# Patient Record
Sex: Female | Born: 1990 | Race: White | Hispanic: No | Marital: Single | Smoking: Current every day smoker
Health system: Southern US, Community
[De-identification: ages and names within clinical notes are randomized; demographics above are authoritative.]

## PROBLEM LIST (undated history)

## (undated) DIAGNOSIS — J45909 Unspecified asthma, uncomplicated: Secondary | ICD-10-CM

## (undated) HISTORY — PX: CHOLECYSTECTOMY: SHX55

## (undated) HISTORY — PX: DILATION AND CURETTAGE OF UTERUS: SHX78

---
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
## 2010-06-29 ENCOUNTER — Encounter: Payer: Self-pay | Admitting: Obstetrics and Gynecology

## 2010-11-13 IMAGING — US US OB DETAIL+14 WK
1 series · 18 of 28 positions shown · non-contrast
Comparison: none

OBSTETRICAL ULTRASOUND:
 This ultrasound was performed in The [HOSPITAL], and the [facility] report will be stored to [REDACTED] PACS.  This report is also available in [HOSPITAL]?s accessANYware.

[Series 1: us ob detail+14 wk · 18 of 72 slices shown]
[im 1/72]
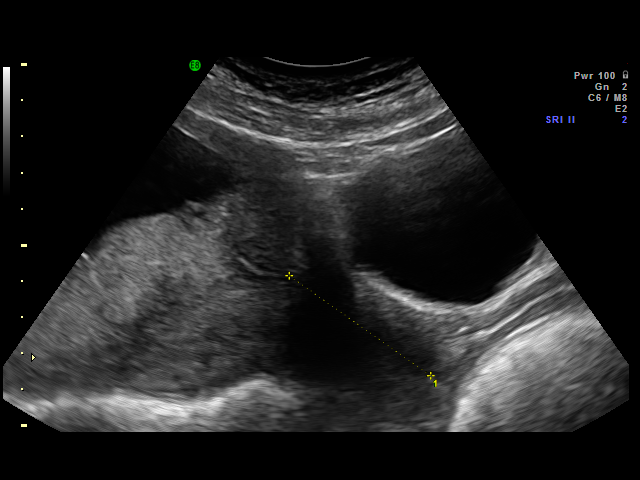
[im 6/72]
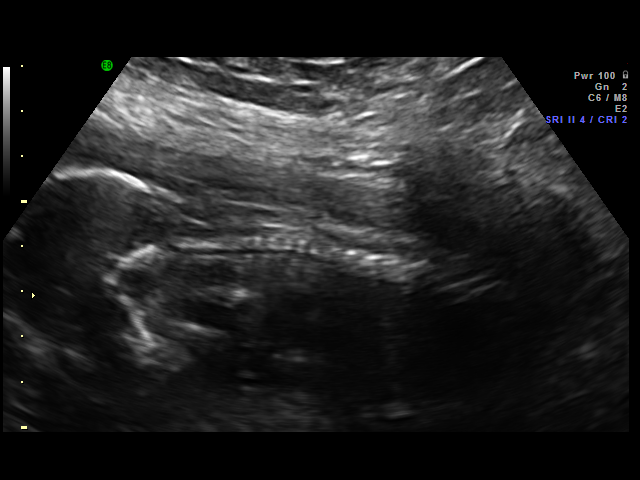
[im 8/72]
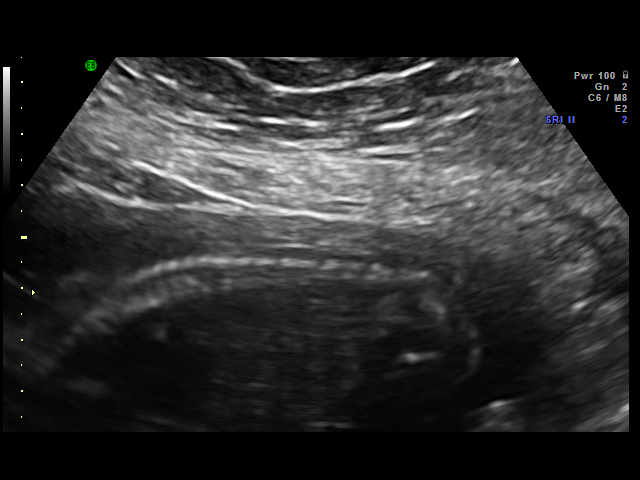
[im 14/72]
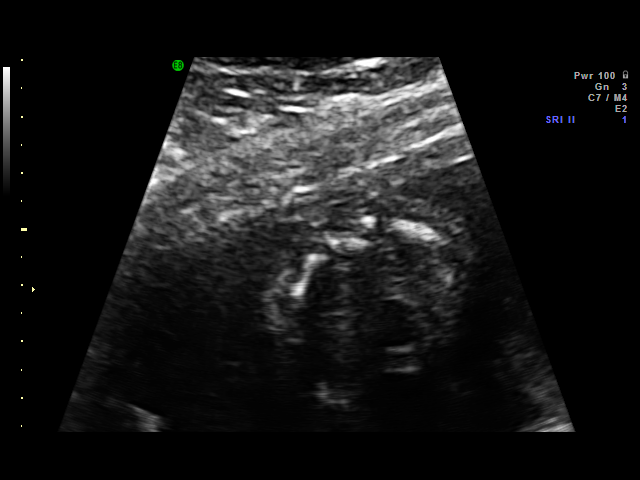
[im 19/72]
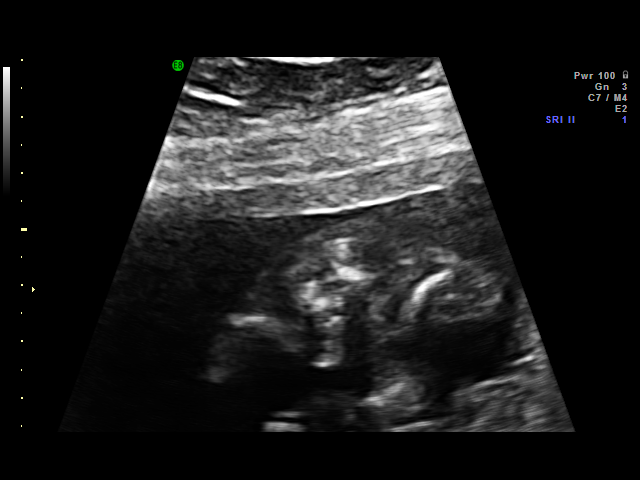
[im 22/72]
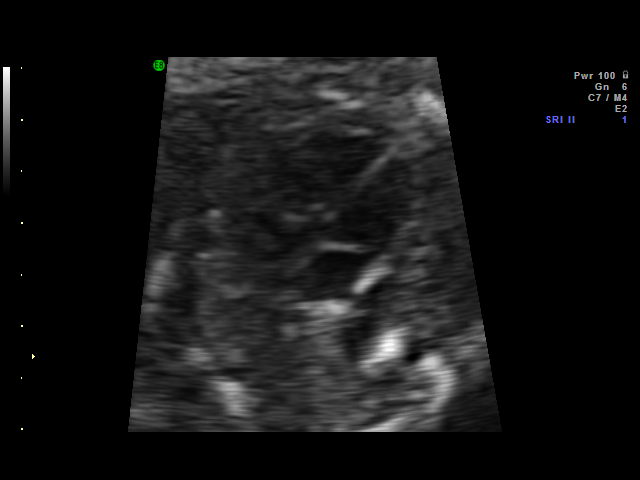
[im 27/72]
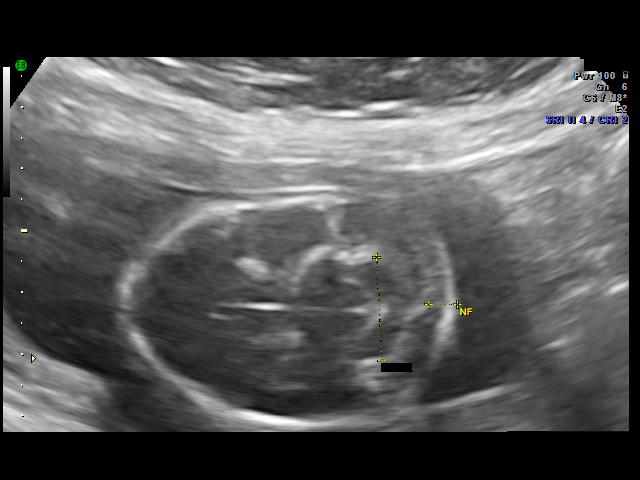
[im 29/72]
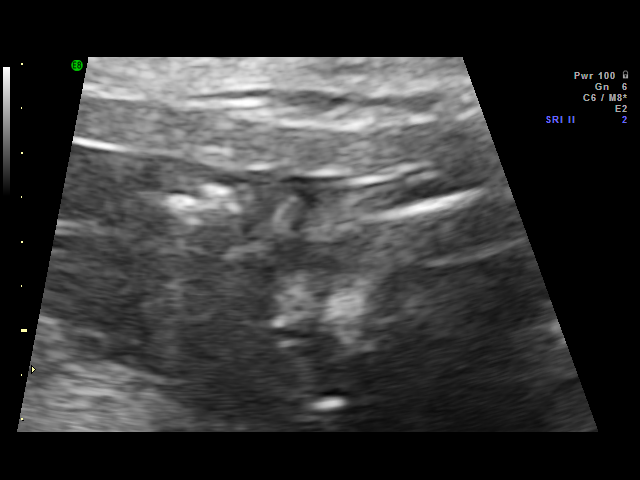
[im 35/72]
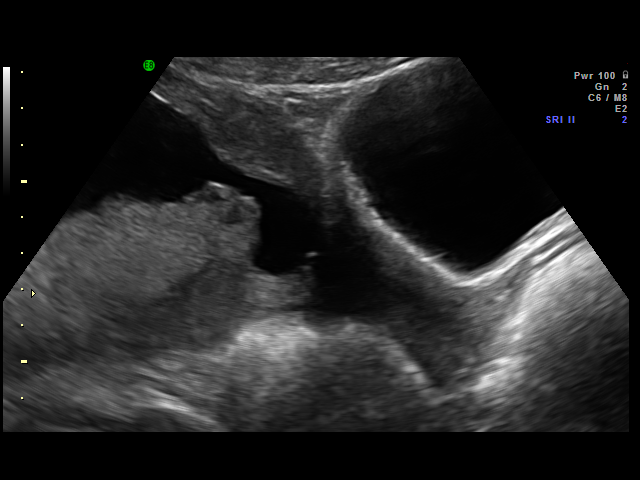
[im 37/72]
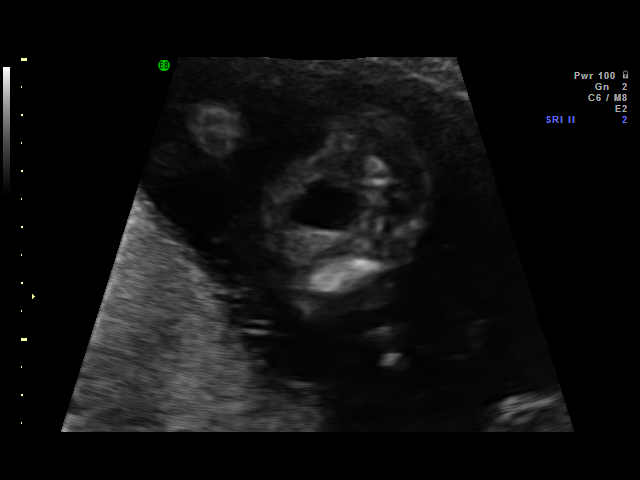
[im 43/72]
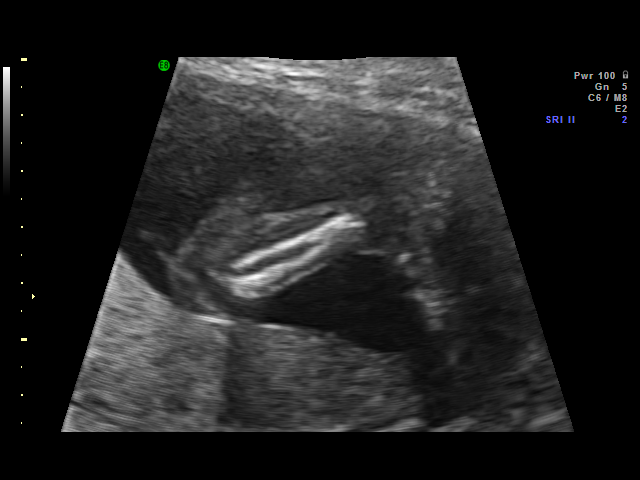
[im 45/72]
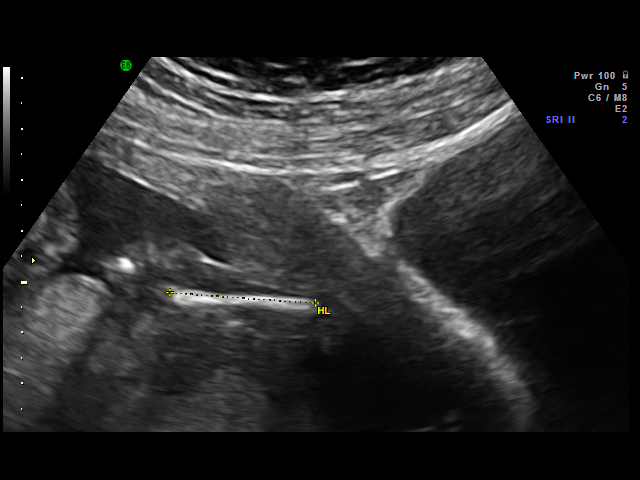
[im 50/72]
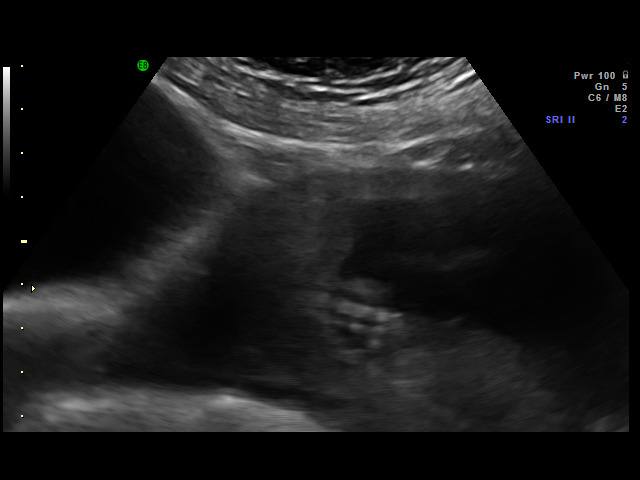
[im 56/72]
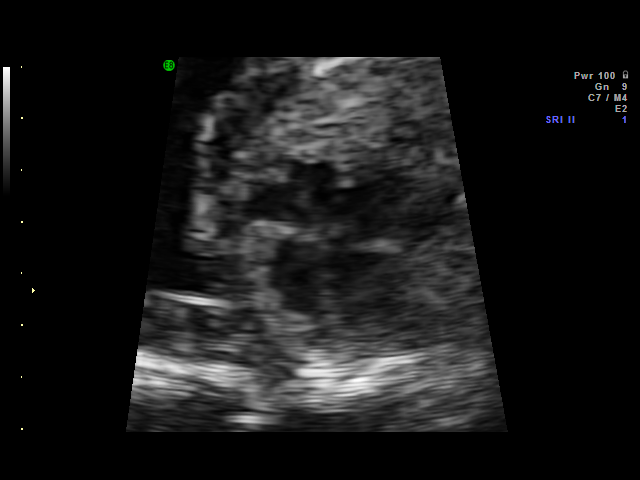
[im 58/72]
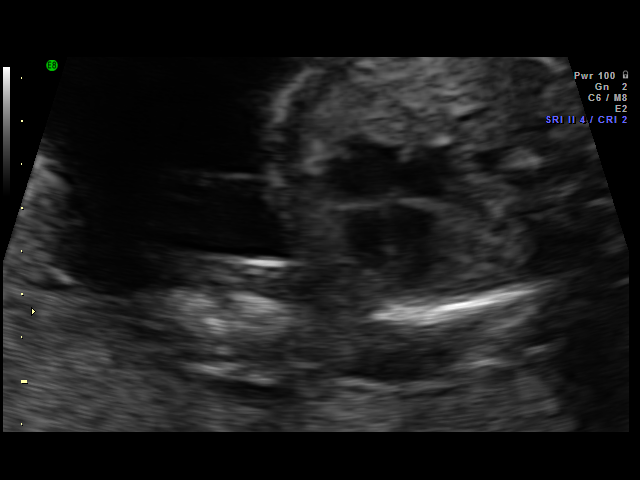
[im 64/72]
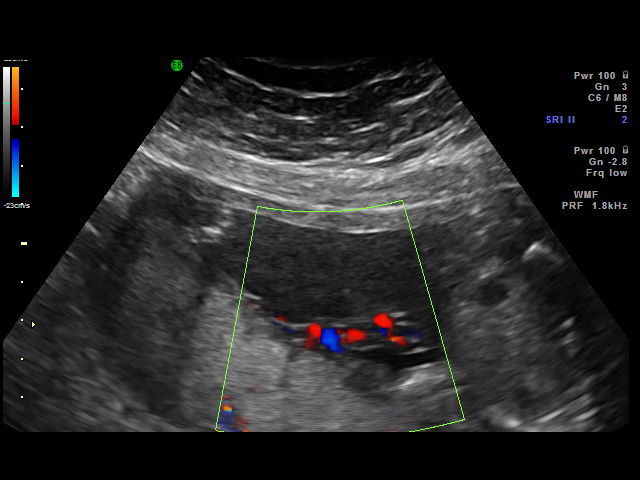
[im 66/72]
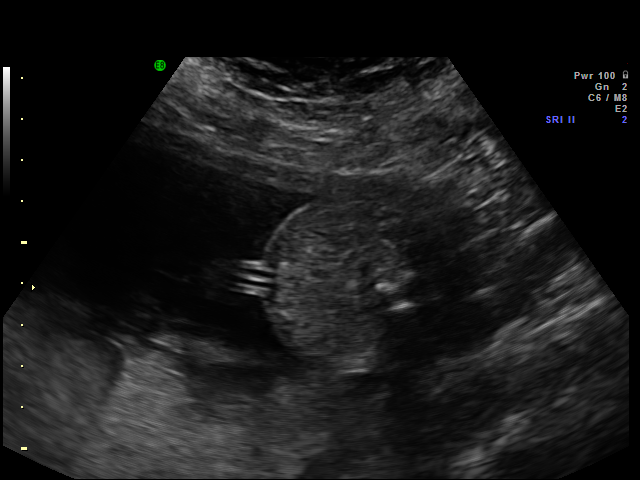
[im 72/72]
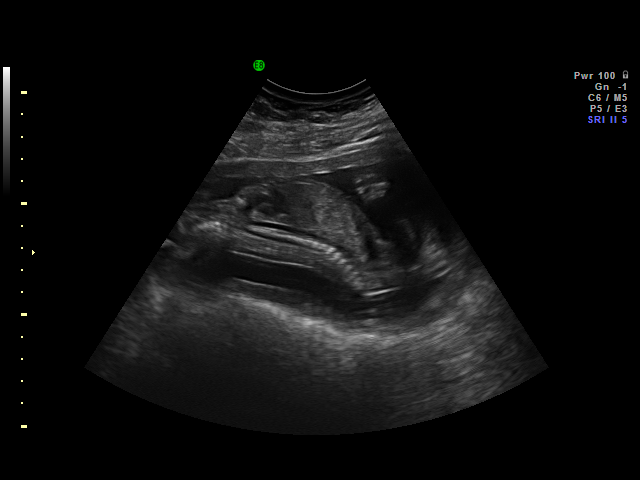

[18 of 28 positions shown; findings below may reference images not displayed]

IMPRESSION: [facility] has also been faxed to the ordering physician.

## 2010-11-27 IMAGING — US US OB FOLLOW-UP
1 series · 18 of 28 positions shown · non-contrast
Comparison: none

OBSTETRICAL ULTRASOUND:
 This ultrasound was performed in The [HOSPITAL], and the [facility] report will be stored to [REDACTED] PACS.  This report is also available in [HOSPITAL]?s accessANYware.

[Series 1: us ob follow-up · 18 of 79 slices shown]
[im 1/79]
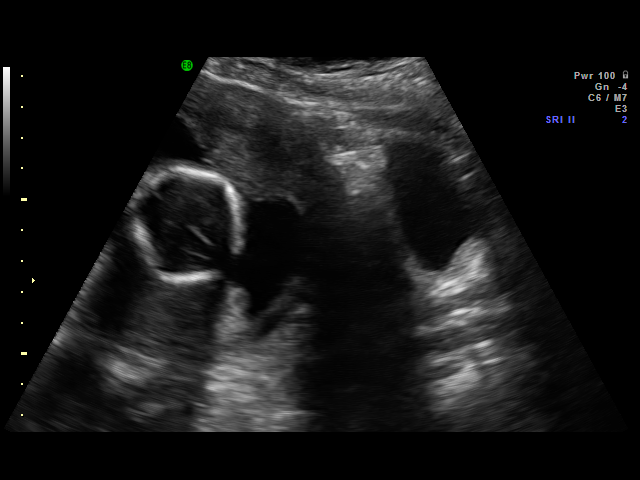
[im 6/79]
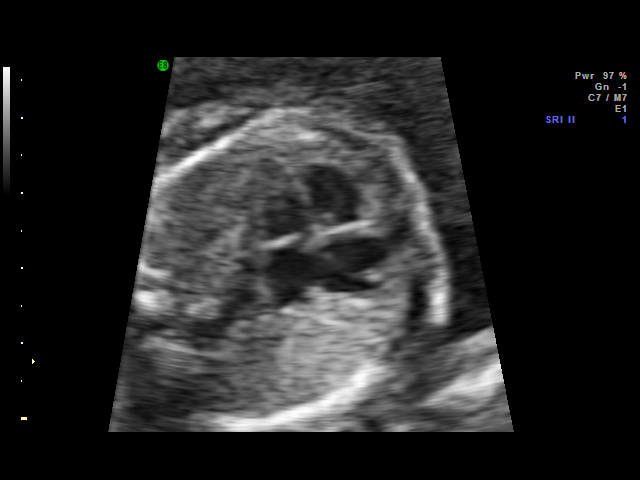
[im 9/79]
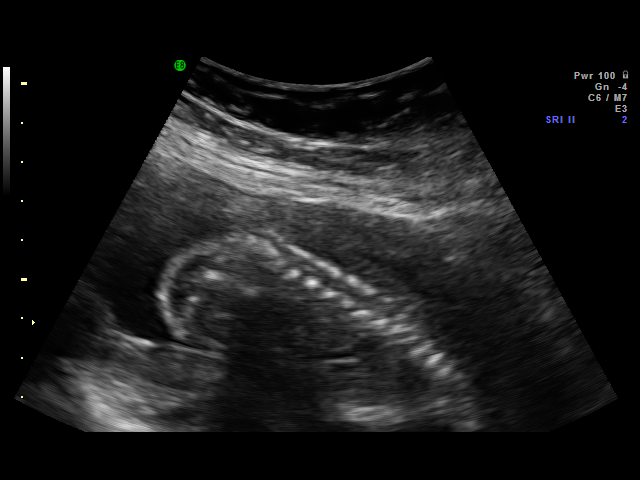
[im 15/79]
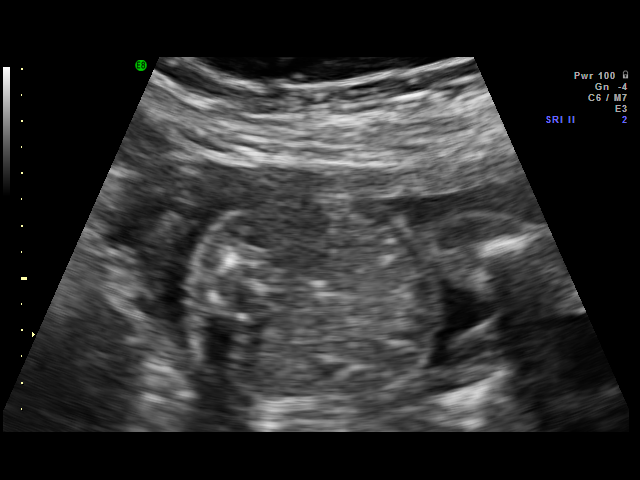
[im 21/79]
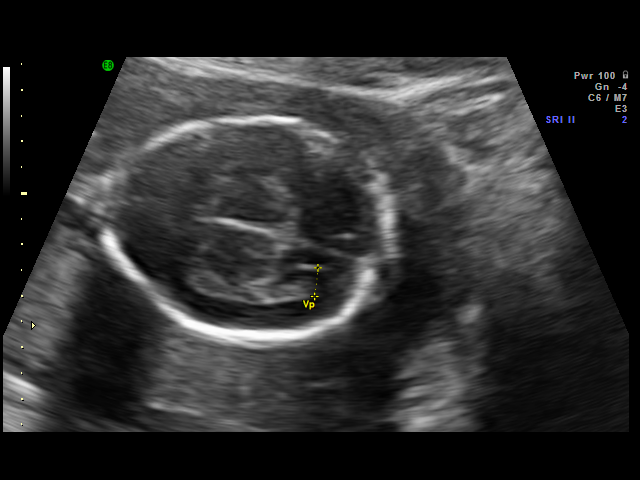
[im 24/79]
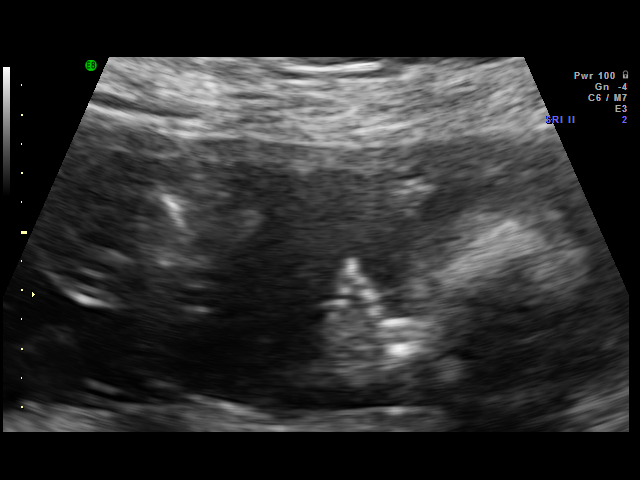
[im 29/79]
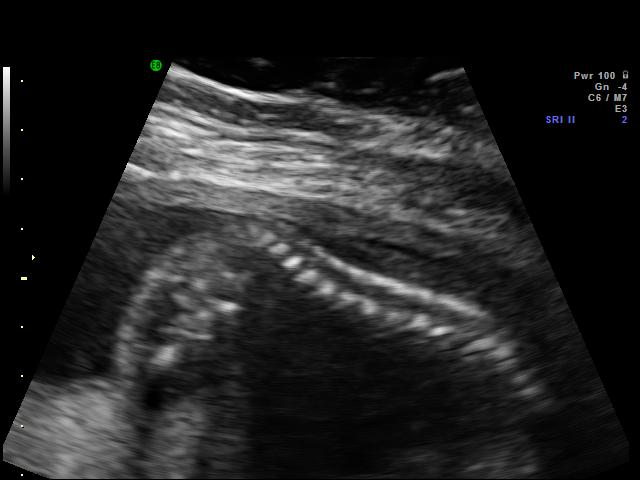
[im 32/79]
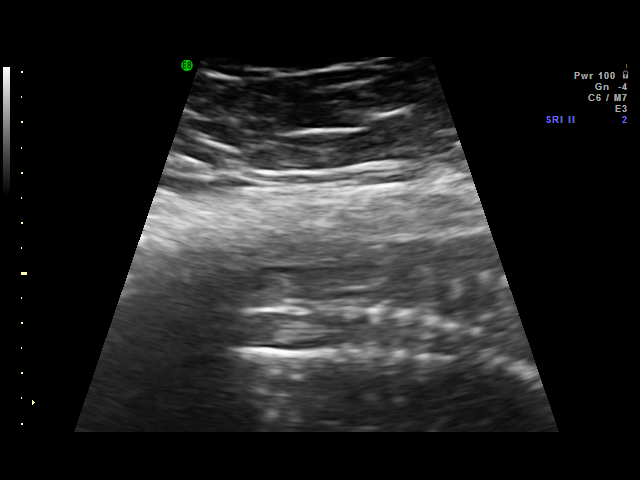
[im 38/79]
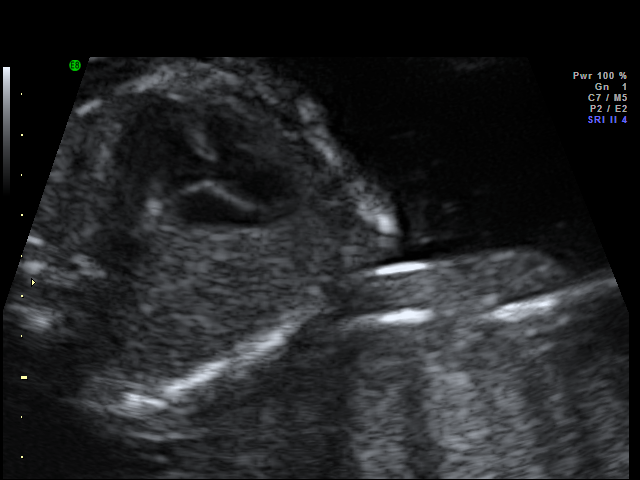
[im 41/79]
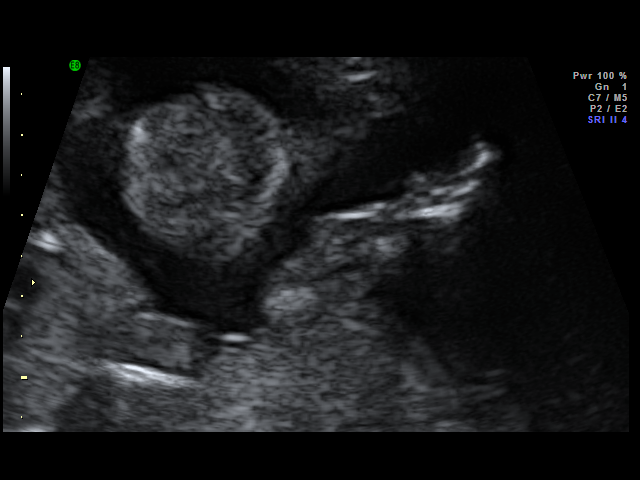
[im 47/79]
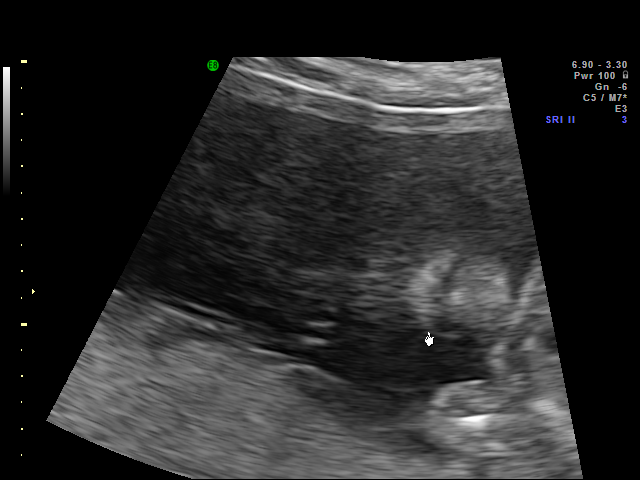
[im 50/79]
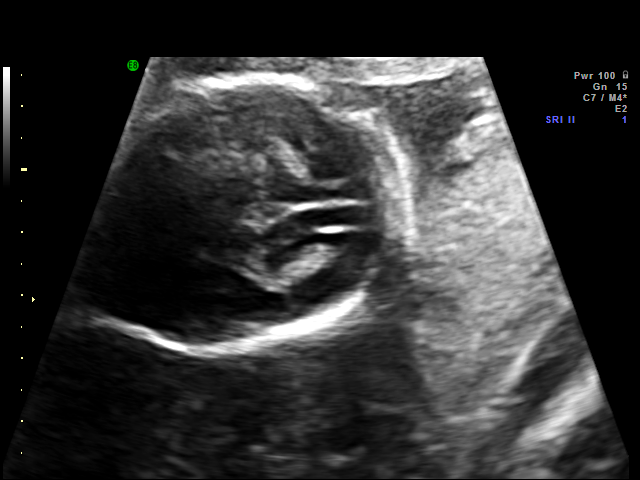
[im 55/79]
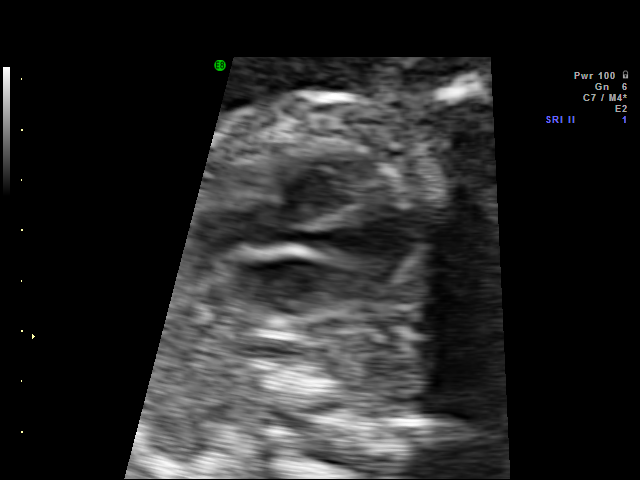
[im 61/79]
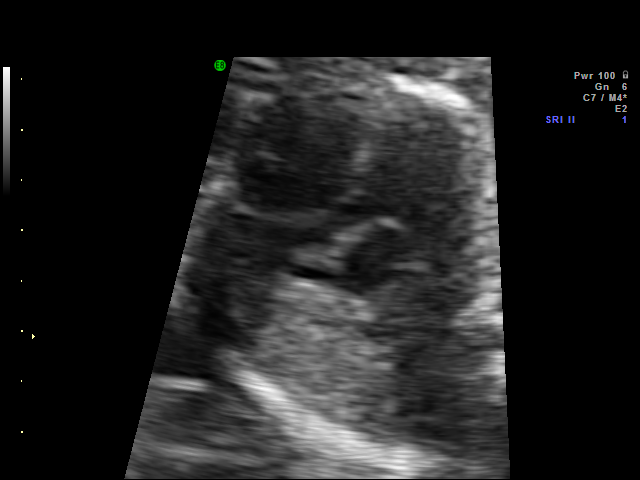
[im 64/79]
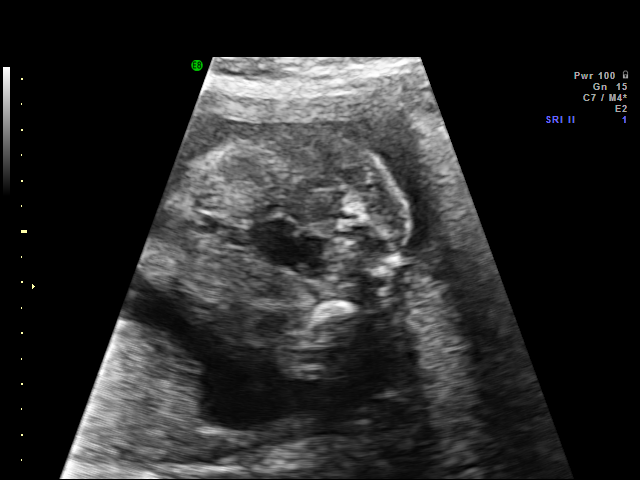
[im 70/79]
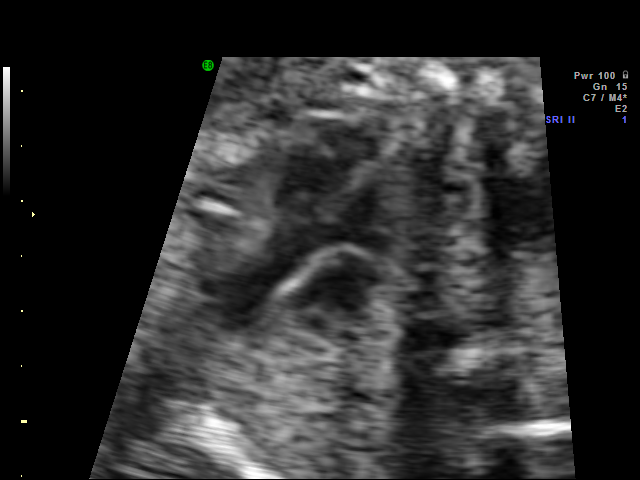
[im 73/79]
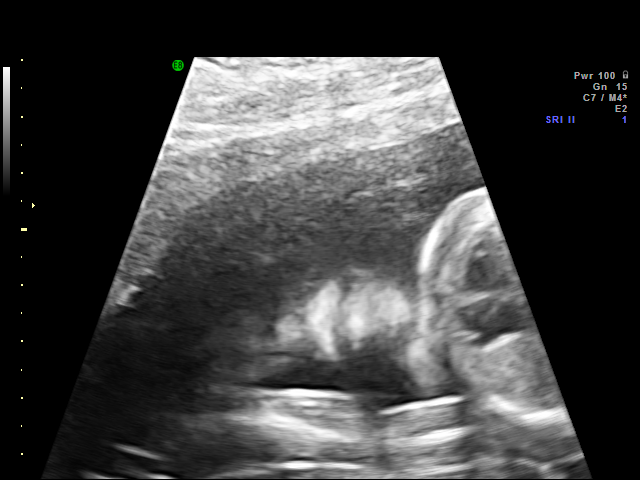
[im 79/79]
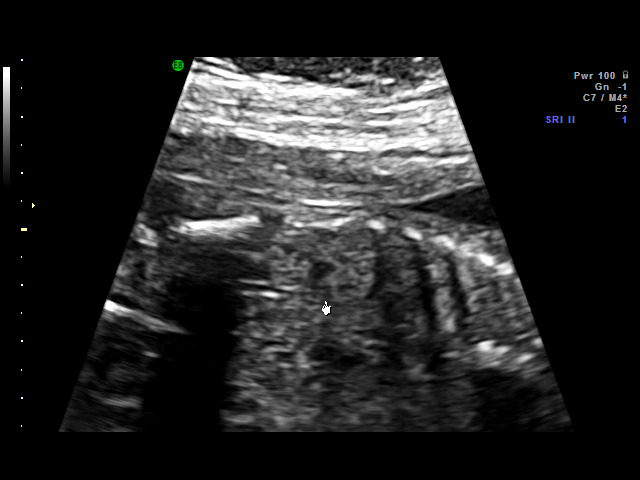

[18 of 28 positions shown; findings below may reference images not displayed]

IMPRESSION: [facility] has also been faxed to the ordering physician.

## 2011-02-12 IMAGING — US US OB FOLLOW-UP
1 series · 14 of 28 positions shown · non-contrast
Comparison: none

OBSTETRICAL ULTRASOUND:
 This ultrasound was performed in The [HOSPITAL], and the [facility] report will be stored to [REDACTED] PACS.  This report is also available in [HOSPITAL]?s accessANYware.

[Series 1: us ob follow-up · 37 acquisitions, 14 frames shown]
[im 2/37]
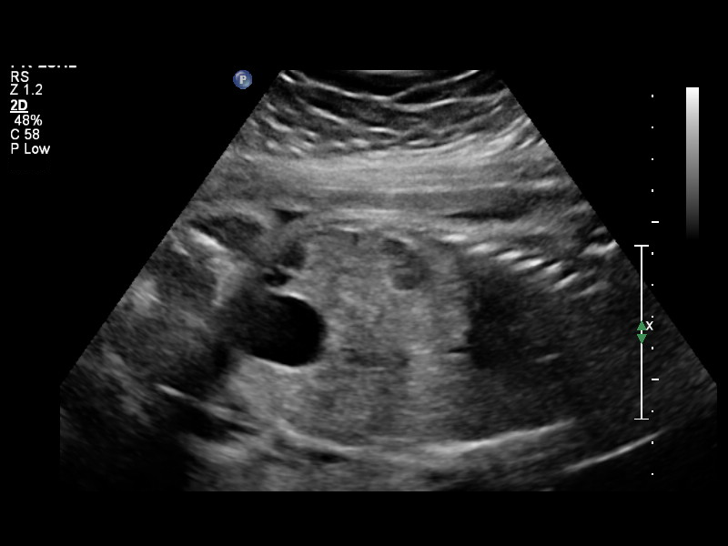
[im 5/37]
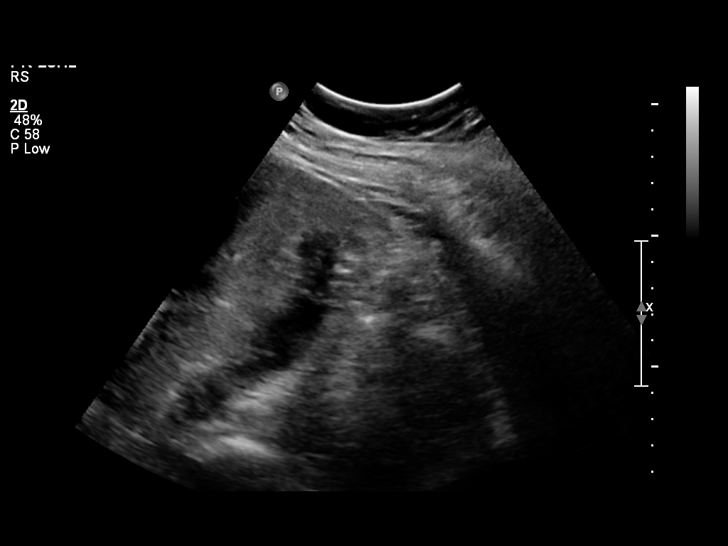
[im 7/37]
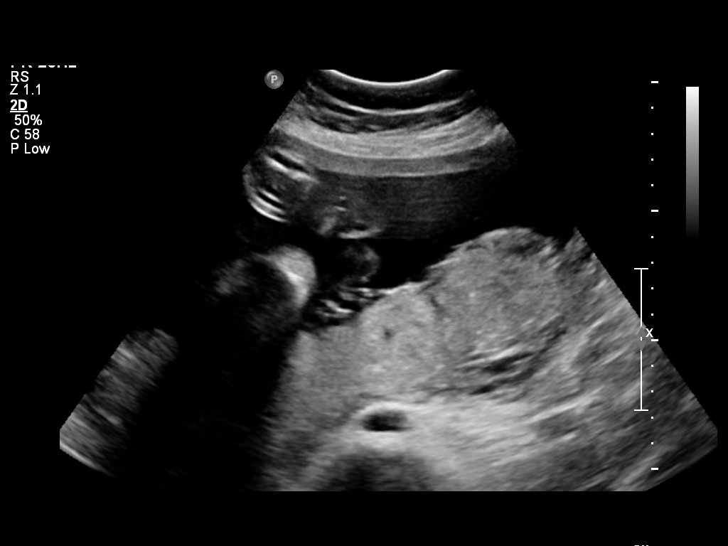
[im 10/37]
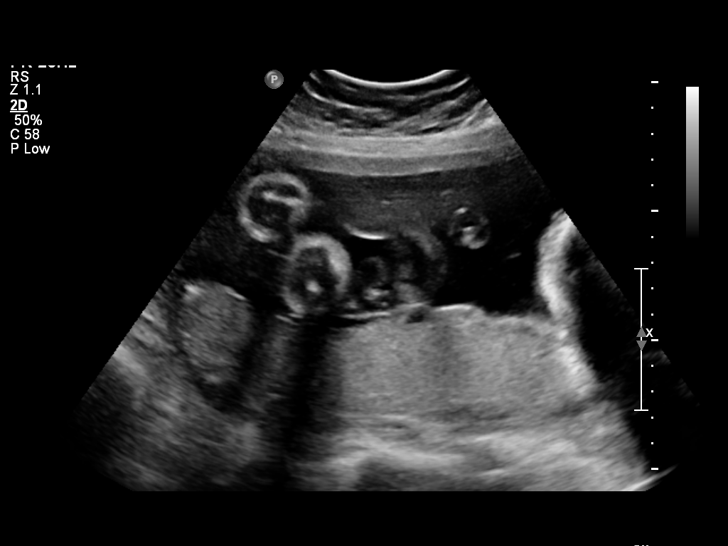
[im 13/37]
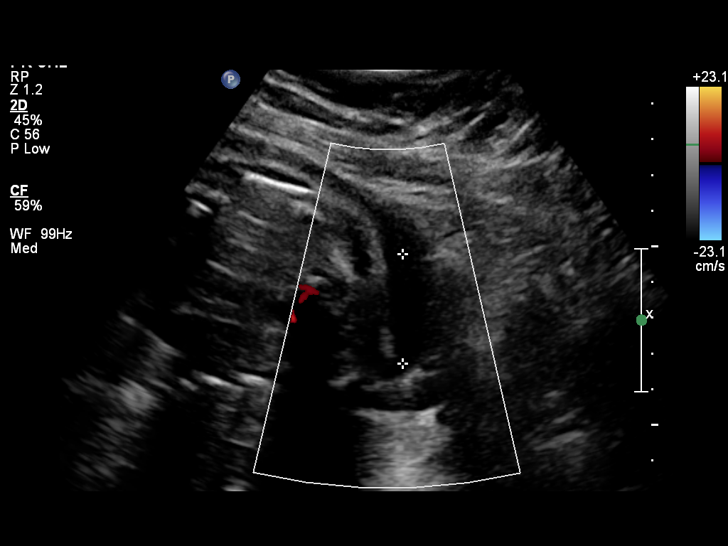
[im 15/37]
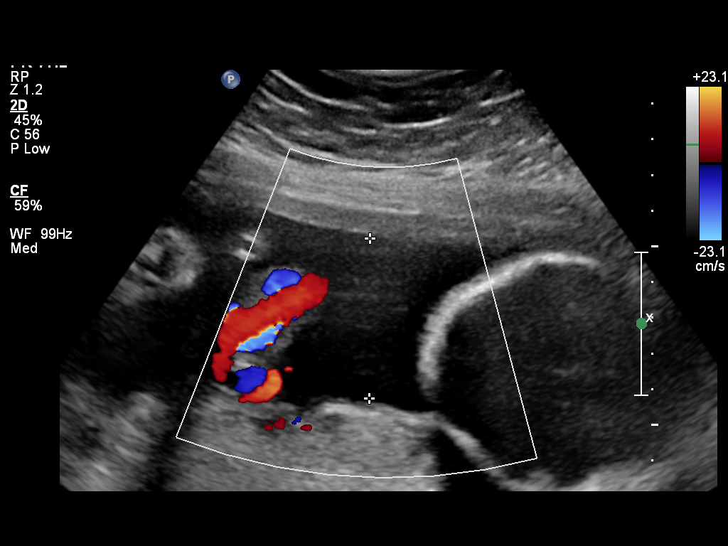
[im 18/37]
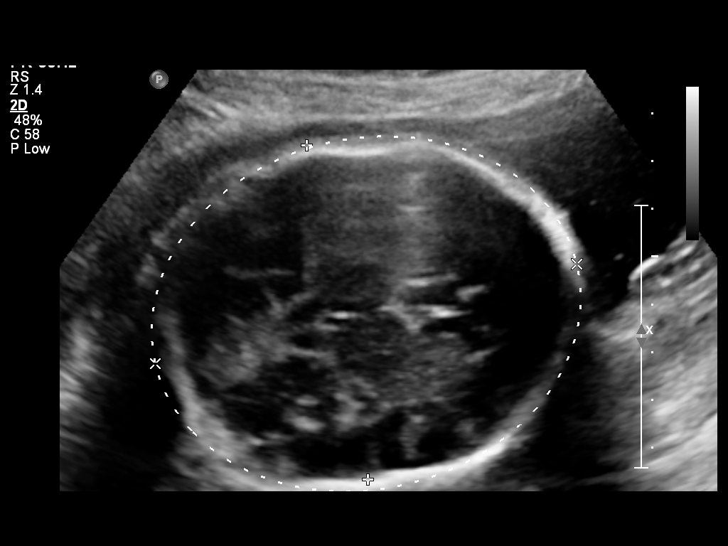
[im 21/37]
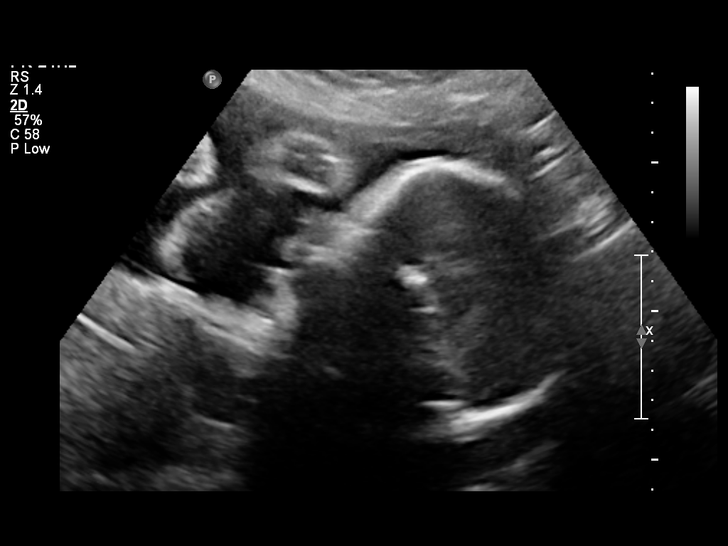
[im 23/37]
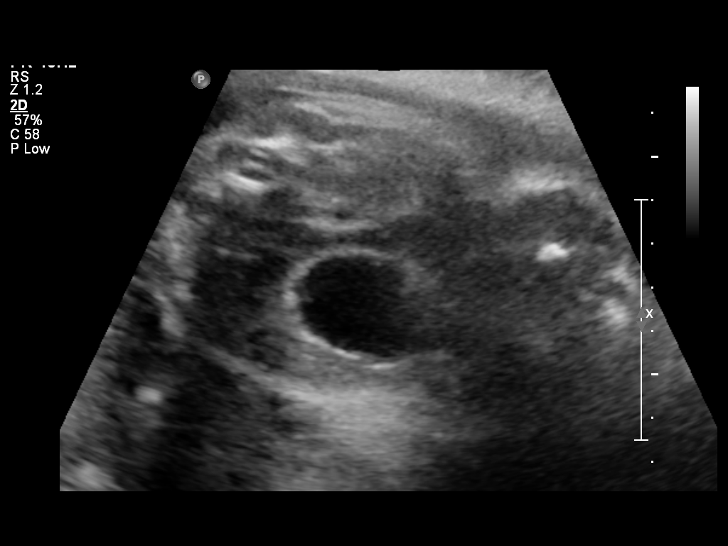
[im 26/37]
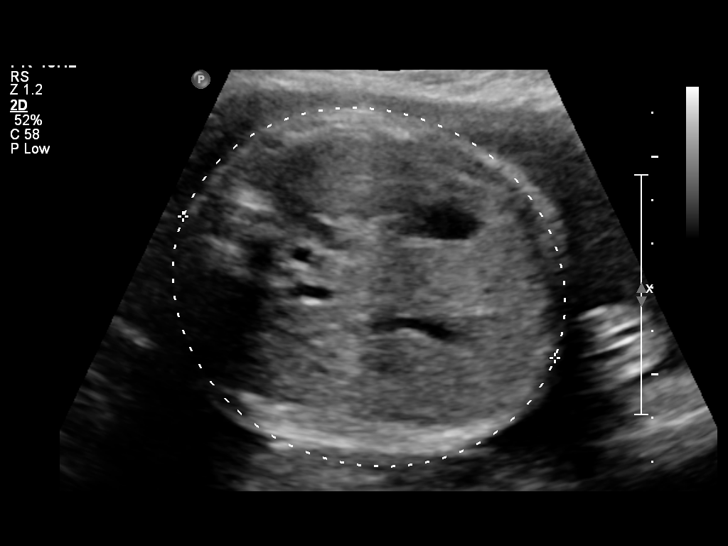
[im 29/37]
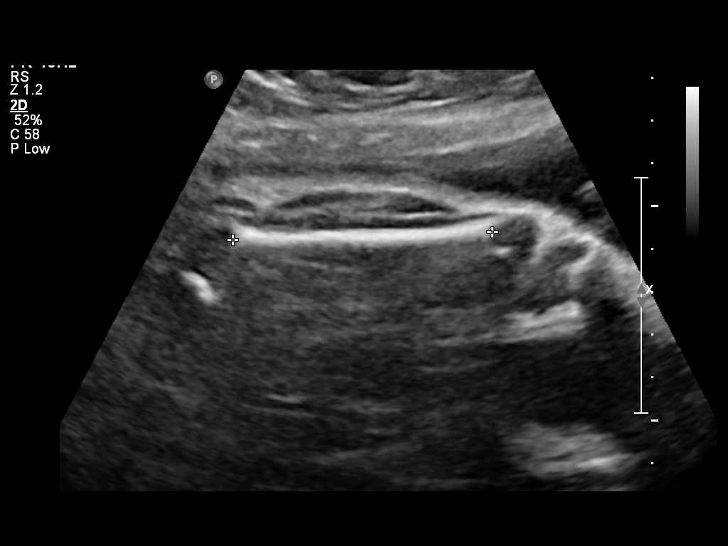
[im 31/37]
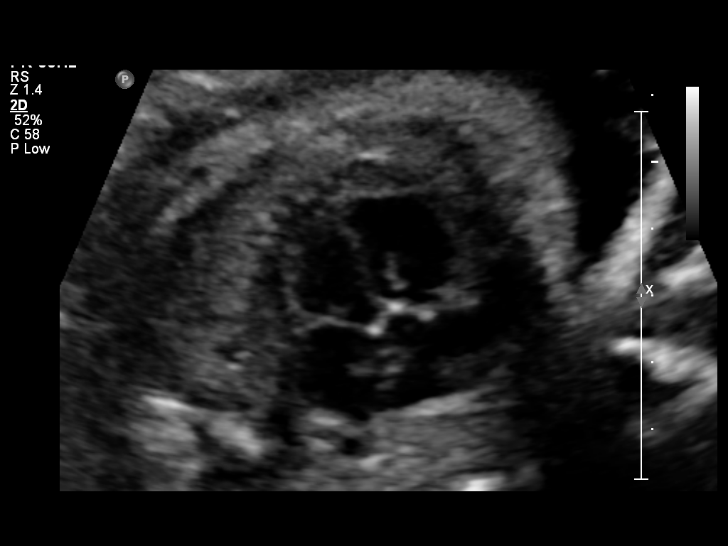
[im 34/37]
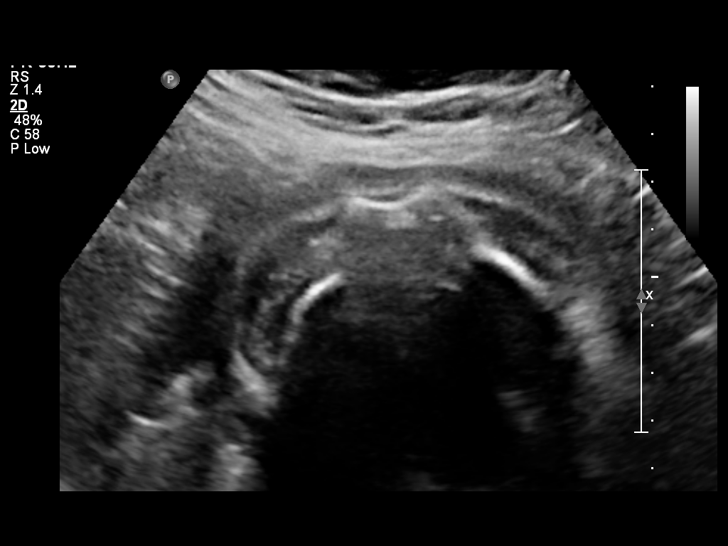
[im 37/37]
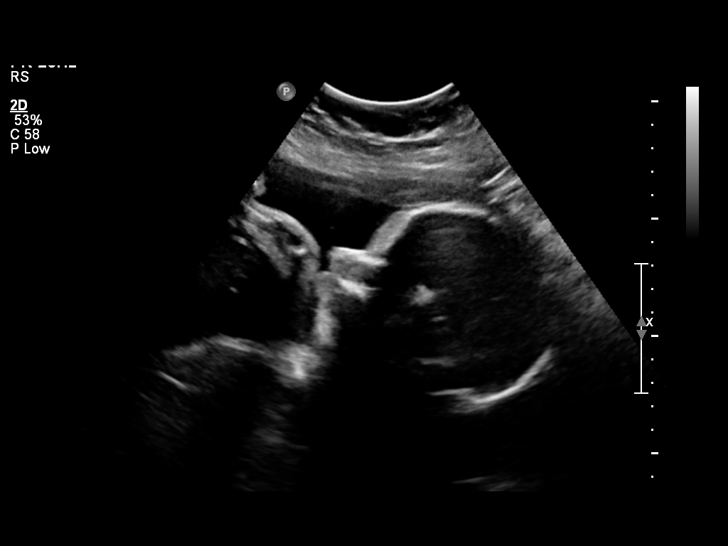

[14 of 28 positions shown; findings below may reference images not displayed]

IMPRESSION: [facility] has also been faxed to the ordering physician.

## 2021-02-03 ENCOUNTER — Emergency Department (HOSPITAL_COMMUNITY)
Admission: EM | Admit: 2021-02-03 | Discharge: 2021-02-04 | Disposition: A | Discharge status: Home / Self Care | Payer: Self-pay | Attending: Emergency Medicine | Admitting: Emergency Medicine

## 2021-02-03 ENCOUNTER — Other Ambulatory Visit: Payer: Self-pay

## 2021-02-03 DIAGNOSIS — Z5321 Procedure and treatment not carried out due to patient leaving prior to being seen by health care provider: Secondary | ICD-10-CM | POA: Insufficient documentation

## 2021-02-03 DIAGNOSIS — K0889 Other specified disorders of teeth and supporting structures: Secondary | ICD-10-CM | POA: Insufficient documentation

## 2021-02-03 DIAGNOSIS — M79643 Pain in unspecified hand: Secondary | ICD-10-CM | POA: Insufficient documentation

## 2021-02-03 NOTE — ED Triage Notes (Signed)
Pt complains of hand pain and hand numbness that has been going on for a month. Pt also complains of dental pain to her left mouth.

## 2021-03-21 ENCOUNTER — Emergency Department (HOSPITAL_COMMUNITY)
Admission: EM | Admit: 2021-03-21 | Discharge: 2021-03-21 | Disposition: A | Discharge status: Home / Self Care | Payer: Self-pay | Attending: Emergency Medicine | Admitting: Emergency Medicine

## 2021-03-21 ENCOUNTER — Encounter (HOSPITAL_COMMUNITY): Payer: Self-pay | Admitting: Emergency Medicine

## 2021-03-21 DIAGNOSIS — J029 Acute pharyngitis, unspecified: Secondary | ICD-10-CM | POA: Insufficient documentation

## 2021-03-21 DIAGNOSIS — J020 Streptococcal pharyngitis: Secondary | ICD-10-CM

## 2021-03-21 DIAGNOSIS — K029 Dental caries, unspecified: Secondary | ICD-10-CM | POA: Insufficient documentation

## 2021-03-21 LAB — GROUP A STREP BY PCR: Group A Strep by PCR: DETECTED — AB

## 2021-03-21 LAB — MONONUCLEOSIS SCREEN: Mono Screen: NEGATIVE

## 2021-03-21 MED ORDER — AMOXICILLIN 500 MG PO CAPS: 500.0000 mg | ORAL_CAPSULE | Freq: Two times a day (BID) | ORAL | 0 refills | Status: AC

## 2021-03-21 NOTE — ED Triage Notes (Signed)
Pt endorses sore throat started today and scratchy voice for a month. Also having bilateral hand numbness for 1-2 months.

## 2021-03-21 NOTE — Discharge Instructions (Addendum)
You have strep throat. Take amoxicillin as described. Return to the emergency room if you develop difficulty breathing, difficulty swallowing.

## 2021-03-21 NOTE — ED Notes (Signed)
Pt A&ox4 ambulatory at d/c with independent steady gait. Pt verbalized understanding of d/c instructions, prescription and follow up care. 

## 2021-03-21 NOTE — ED Provider Notes (Signed)
Emergency Medicine Provider Triage Evaluation Note  Kristen Porter , a 30 y.o. female  was evaluated in triage.  Pt complains of sore throat.  Review of Systems  Positive: Sore throat, hoarseness Negative: Fever, cough  Physical Exam  BP 129/90 (BP Location: Left Arm)   Pulse 90   Temp 98.5 F (36.9 C) (Oral)   Resp 18   Ht 5\' 6"  (1.676 m)   Wt 90.7 kg   SpO2 98%   BMI 32.28 kg/m  Gen:   Awake, no distress   Resp:  Normal effort  MSK:   Moves extremities without difficulty  Other:  Hoarseness.  Throat exam with mild postoropharyngeal erythema  Medical Decision Making  Medically screening exam initiated at 5:07 PM.  Appropriate orders placed.  Kristen Porter was informed that the remainder of the evaluation will be completed by another provider, this initial triage assessment does not replace that evaluation, and the importance of remaining in the ED until their evaluation is complete.  Sore throat and hoarseness x 1 month.  Also request rehab for heroine use.    Kristen Spry, PA-C 03/21/21 1708    03/23/21, MD 03/21/21 [phone]

## 2021-03-21 NOTE — ED Provider Notes (Signed)
[facility] EMERGENCY DEPARTMENT Provider Note   CSN: [id number] Arrival date & time: 03/21/21  1655     History Chief Complaint  Patient presents with   Sore Throat    Kristen Porter is a 30 y.o. female.  30 year old female presents today for evaluation of hoarse voice of 1 month duration and sore throat 3-day duration.  Endorses fever and chills of 1 day duration.  She reports about 1 month ago she had left lower tooth abscess which was not evaluated or treated and self resolved.  She reports since then she has had a hoarse voice initially resolving a week later and returning the next week following cold weather.  Of note she is homeless.  She endorses snorting heroin but denies IV use.  Denies other illicit drug use.  She reports she is seeking treatment and has been in contact with a rehab program who asked her to get evaluated for her symptoms before proceeding.  She denies shortness of breath, lightheadedness, headache, or changes in her vision.  She denies difficulty swallowing.  She has not tried anything over-the-counter for this.  The history is provided by the patient. No language interpreter was used.      History reviewed. No pertinent past medical history.  There are no problems to display for this patient.   History reviewed. No pertinent surgical history.   OB History   No obstetric history on file.     No family history on file.     Home Medications Prior to Admission medications   Not on File    Allergies    Morphine and related, Toradol [ketorolac tromethamine], and Tramadol  Review of Systems   Review of Systems  Constitutional:  Positive for chills and fever. Negative for activity change and diaphoresis.  HENT:  Positive for sore throat. Negative for drooling, sinus pressure and trouble swallowing.   Respiratory:  Positive for cough. Negative for shortness of breath.   Cardiovascular:  Negative for chest pain and  palpitations.  Gastrointestinal:  Negative for nausea and vomiting.  Skin:  Negative for rash.  Neurological:  Negative for headaches.   Physical Exam Updated Vital Signs BP 129/90 (BP Location: Left Arm)   Pulse 90   Temp 98.5 F (36.9 C) (Oral)   Resp 18   Ht 5\' 6"  (1.676 m)   Wt 90.7 kg   SpO2 98%   BMI 32.28 kg/m   Physical Exam Vitals and nursing note reviewed.  Constitutional:      General: She is not in acute distress.    Appearance: Normal appearance. She is not ill-appearing.  HENT:     Head: Normocephalic and atraumatic.     Nose: Nose normal.     Mouth/Throat:     Mouth: Mucous membranes are moist.     Dentition: Abnormal dentition. Dental caries present. No gingival swelling or dental abscesses.     Pharynx: Oropharynx is clear. Uvula midline. No pharyngeal swelling, oropharyngeal exudate, posterior oropharyngeal erythema or uvula swelling.     Tonsils: No tonsillar exudate or tonsillar abscesses.  Eyes:     Conjunctiva/sclera: Conjunctivae normal.  Cardiovascular:     Rate and Rhythm: Normal rate and regular rhythm.  Pulmonary:     Effort: Pulmonary effort is normal. No respiratory distress.     Breath sounds: Normal breath sounds. No wheezing or rales.  Musculoskeletal:        General: No deformity.  Skin:    Findings: No rash.  Neurological:     Mental Status: She is alert.    ED Results / Procedures / Treatments   Labs (all labs ordered are listed, but only abnormal results are displayed) Labs Reviewed  GROUP A STREP BY PCR - Abnormal; Notable for the following components:      Result Value   Group A Strep by PCR DETECTED (*)    All other components within normal limits  MONONUCLEOSIS SCREEN    EKG None  Radiology No results found.  Procedures Procedures   Medications Ordered in ED Medications - No data to display  ED Course  I have reviewed the triage vital signs and the nursing notes.  Pertinent labs & imaging results that were  available during my care of the patient were reviewed by me and considered in my medical decision making (see chart for details).    MDM Rules/Calculators/A&P                           30 year old female presents today for evaluation of pharyngitis of 3 day duration and hoarse voice of 1 month duration.  She denies difficulty swallowing, lightheadedness, palpitations.  Reports couple episodes of fever and chills yesterday.  Her Monospot is negative.  She is positive for strep pharyngitis. Amoxicillin prescribed.   Final Clinical Impression(s) / ED Diagnoses Final diagnoses:  None    Rx / DC Orders ED Discharge Orders     None        Marita Kansas, PA-C 03/21/21 2223    Gloris Manchester, MD 03/23/21 1221

## 2021-05-08 DIAGNOSIS — R011 Cardiac murmur, unspecified: Secondary | ICD-10-CM

## 2021-06-18 ENCOUNTER — Encounter (HOSPITAL_COMMUNITY): Payer: Self-pay | Admitting: Emergency Medicine

## 2021-06-18 ENCOUNTER — Emergency Department (HOSPITAL_COMMUNITY)
Admission: EM | Admit: 2021-06-18 | Discharge: 2021-06-18 | Disposition: A | Discharge status: Home / Self Care | Payer: Self-pay | Attending: Emergency Medicine | Admitting: Emergency Medicine

## 2021-06-18 ENCOUNTER — Other Ambulatory Visit: Payer: Self-pay

## 2021-06-18 DIAGNOSIS — Z5321 Procedure and treatment not carried out due to patient leaving prior to being seen by health care provider: Secondary | ICD-10-CM | POA: Insufficient documentation

## 2021-06-18 DIAGNOSIS — Z0279 Encounter for issue of other medical certificate: Secondary | ICD-10-CM | POA: Insufficient documentation

## 2021-06-18 HISTORY — DX: Unspecified asthma, uncomplicated: J45.909

## 2021-06-18 NOTE — ED Triage Notes (Signed)
Pt states she just got out of 7 day program to get clean and relapsed snorting heroin this am. Pt states she is homeless and doesn't want to be on the streets and has thought about suicide but no plan.

## 2021-06-18 NOTE — ED Notes (Signed)
Pt states she doesn't want to be here. States she is leaving with her mom.

## 2021-06-27 ENCOUNTER — Encounter (HOSPITAL_COMMUNITY): Payer: Self-pay

## 2021-06-27 ENCOUNTER — Other Ambulatory Visit: Payer: Self-pay

## 2021-06-27 ENCOUNTER — Emergency Department (HOSPITAL_COMMUNITY)
Admission: EM | Admit: 2021-06-27 | Discharge: 2021-06-29 | Disposition: A | Discharge status: Nursing Home (Includes ICF) | Payer: Self-pay | Attending: Emergency Medicine | Admitting: Emergency Medicine

## 2021-06-27 DIAGNOSIS — F1193 Opioid use, unspecified with withdrawal: Secondary | ICD-10-CM

## 2021-06-27 DIAGNOSIS — N9489 Other specified conditions associated with female genital organs and menstrual cycle: Secondary | ICD-10-CM | POA: Insufficient documentation

## 2021-06-27 DIAGNOSIS — F1113 Opioid abuse with withdrawal: Secondary | ICD-10-CM | POA: Insufficient documentation

## 2021-06-27 DIAGNOSIS — F1990 Other psychoactive substance use, unspecified, uncomplicated: Secondary | ICD-10-CM

## 2021-06-27 DIAGNOSIS — Z20822 Contact with and (suspected) exposure to covid-19: Secondary | ICD-10-CM | POA: Insufficient documentation

## 2021-06-27 DIAGNOSIS — F1924 Other psychoactive substance dependence with psychoactive substance-induced mood disorder: Secondary | ICD-10-CM | POA: Insufficient documentation

## 2021-06-27 DIAGNOSIS — R45851 Suicidal ideations: Secondary | ICD-10-CM | POA: Insufficient documentation

## 2021-06-27 LAB — COMPREHENSIVE METABOLIC PANEL
ALT: 43 U/L (ref 0–44)
AST: 38 U/L (ref 15–41)
Albumin: 3.5 g/dL (ref 3.5–5.0)
Alkaline Phosphatase: 70 U/L (ref 38–126)
Anion gap: 8 (ref 5–15)
BUN: 13 mg/dL (ref 6–20)
CO2: 25 mmol/L (ref 22–32)
Calcium: 8.6 mg/dL — ABNORMAL LOW (ref 8.9–10.3)
Chloride: 104 mmol/L (ref 98–111)
Creatinine, Ser: 0.64 mg/dL (ref 0.44–1.00)
GFR, Estimated: >60 mL/min (ref >60)
Glucose, Bld: 121 mg/dL — ABNORMAL HIGH (ref 70–99)
Potassium: 3.9 mmol/L (ref 3.5–5.1)
Sodium: 137 mmol/L (ref 135–145)
Total Bilirubin: 0.4 mg/dL (ref 0.3–1.2)
Total Protein: 7.7 g/dL (ref 6.5–8.1)

## 2021-06-27 LAB — CBC WITH DIFFERENTIAL/PLATELET
Abs Immature Granulocytes: 0.01 10*3/uL (ref 0.00–0.07)
Basophils Absolute: 0 10*3/uL (ref 0.0–0.1)
Basophils Relative: 0 %
Eosinophils Absolute: 0.1 10*3/uL (ref 0.0–0.5)
Eosinophils Relative: 2 %
HCT: 42.1 % (ref 36.0–46.0)
Hemoglobin: 13.4 g/dL (ref 12.0–15.0)
Immature Granulocytes: 0 %
Lymphocytes Relative: 32 %
Lymphs Abs: 2.1 10*3/uL (ref 0.7–4.0)
MCH: 28.3 pg (ref 26.0–34.0)
MCHC: 31.8 g/dL (ref 30.0–36.0)
MCV: 89 fL (ref 80.0–100.0)
Monocytes Absolute: 0.5 10*3/uL (ref 0.1–1.0)
Monocytes Relative: 8 %
Neutro Abs: 3.8 10*3/uL (ref 1.7–7.7)
Neutrophils Relative %: 58 %
Platelets: 271 10*3/uL (ref 150–400)
RBC: 4.73 MIL/uL (ref 3.87–5.11)
RDW: 14.6 % (ref 11.5–15.5)
WBC: 6.6 10*3/uL (ref 4.0–10.5)
nRBC: 0 % (ref 0.0–0.2)

## 2021-06-27 LAB — URINALYSIS, ROUTINE W REFLEX MICROSCOPIC
Bilirubin Urine: NEGATIVE
Glucose, UA: NEGATIVE mg/dL
Hgb urine dipstick: NEGATIVE
Ketones, ur: 5 mg/dL — AB
Leukocytes,Ua: NEGATIVE
Nitrite: NEGATIVE
Protein, ur: NEGATIVE mg/dL
Specific Gravity, Urine: 1.028 (ref 1.005–1.030)
pH: 5 (ref 5.0–8.0)

## 2021-06-27 LAB — RAPID URINE DRUG SCREEN, HOSP PERFORMED
Amphetamines: POSITIVE — AB
Barbiturates: NOT DETECTED
Benzodiazepines: NOT DETECTED
Cocaine: POSITIVE — AB
Opiates: POSITIVE — AB
Tetrahydrocannabinol: POSITIVE — AB

## 2021-06-27 LAB — RESP PANEL BY RT-PCR (FLU A&B, COVID) ARPGX2
Influenza A by PCR: NEGATIVE
Influenza B by PCR: NEGATIVE
SARS Coronavirus 2 by RT PCR: NEGATIVE

## 2021-06-27 LAB — I-STAT BETA HCG BLOOD, ED (MC, WL, AP ONLY): I-stat hCG, quantitative: <5 m[IU]/mL (ref <5)

## 2021-06-27 LAB — ACETAMINOPHEN LEVEL: Acetaminophen (Tylenol), Serum: <10 ug/mL — ABNORMAL LOW (ref 10–30)

## 2021-06-27 LAB — ETHANOL: Alcohol, Ethyl (B): <10 mg/dL (ref <10)

## 2021-06-27 LAB — SALICYLATE LEVEL: Salicylate Lvl: <7 mg/dL — ABNORMAL LOW (ref 7.0–30.0)

## 2021-06-27 LAB — LIPASE, BLOOD: Lipase: 24 U/L (ref 11–51)

## 2021-06-27 MED ORDER — CLONIDINE HCL 0.1 MG PO TABS: 0.1000 mg | ORAL_TABLET | Freq: Every day | ORAL | Status: DC

## 2021-06-27 MED ORDER — DICYCLOMINE HCL 20 MG PO TABS
20.0000 mg | ORAL_TABLET | Freq: Four times a day (QID) | ORAL | Status: DC | PRN
  Administered 2021-06-28: 20 mg via ORAL
  Filled 2021-06-27: qty 1

## 2021-06-27 MED ORDER — HYDROXYZINE HCL 25 MG PO TABS
25.0000 mg | ORAL_TABLET | Freq: Four times a day (QID) | ORAL | Status: DC | PRN
  Administered 2021-06-28: 25 mg via ORAL
  Filled 2021-06-27: qty 1

## 2021-06-27 MED ORDER — CLONIDINE HCL 0.1 MG PO TABS
0.1000 mg | ORAL_TABLET | Freq: Four times a day (QID) | ORAL | Status: DC
  Administered 2021-06-28 (×5): 0.1 mg via ORAL
  Filled 2021-06-27 (×4): qty 1

## 2021-06-27 MED ORDER — LOPERAMIDE HCL 2 MG PO CAPS: 2.0000 mg | ORAL_CAPSULE | ORAL | Status: DC | PRN

## 2021-06-27 MED ORDER — CLONIDINE HCL 0.1 MG PO TABS: 0.1000 mg | ORAL_TABLET | Freq: Two times a day (BID) | ORAL | Status: DC

## 2021-06-27 MED ORDER — NAPROXEN 500 MG PO TABS
500.0000 mg | ORAL_TABLET | Freq: Two times a day (BID) | ORAL | Status: DC | PRN
  Administered 2021-06-28: 500 mg via ORAL
  Filled 2021-06-27: qty 1

## 2021-06-27 MED ORDER — METHOCARBAMOL 500 MG PO TABS
500.0000 mg | ORAL_TABLET | Freq: Three times a day (TID) | ORAL | Status: DC | PRN
  Administered 2021-06-28: 500 mg via ORAL
  Filled 2021-06-27: qty 1

## 2021-06-27 MED ORDER — ONDANSETRON 4 MG PO TBDP: 4.0000 mg | ORAL_TABLET | Freq: Four times a day (QID) | ORAL | Status: DC | PRN

## 2021-06-27 NOTE — ED Provider Triage Note (Signed)
Emergency Medicine Provider Triage Evaluation Note  Kristen Porter , a 31 y.o. female  was evaluated in triage.  Pt complains of abdominal pain, vomiting, diarrhea and suicidal thoughts.  Patient reports she has been having generalized abdominal pain associated with nausea, vomiting and diarrhea for the past 3 days.  Think she may have eaten some bad food or been out in the weather.  She is currently homeless.  No blood in the emesis or stool.  Unsure of any fevers.  Also reports last night she started having persistent suicidal thoughts with plan to jump in front of a car or jump off a building.  No self-harm or attempts at suicide prior to arrival.  Reports she was recently in a treatment program but relapsed and began using heroin again, last used this morning.  Review of Systems  Positive: Abdominal pain, nausea, vomiting, diarrhea, SI Negative: Fevers, hematemesis, hematochezia, chest pain, shortness of breath, HI, AVH  Physical Exam  BP 122/83 (BP Location: Left Arm)    Pulse 61    Temp 97.6 F (36.4 C) (Oral)    Resp 16    Ht 5\' 6"  (1.676 m)    Wt 86.2 kg    LMP 06/08/2021 (Exact Date)    SpO2 100%    BMI 30.67 kg/m  Gen:   Awake, no distress, tearful Resp:  Normal effort  MSK:   Moves extremities without difficulty  Other:  Mild generalized abdominal tenderness throughout that does not localize to 1 quadrant  Medical Decision Making  Medically screening exam initiated at 7:34 PM.  Appropriate orders placed.  PAULA BUSENBARK was informed that the remainder of the evaluation will be completed by another provider, this initial triage assessment does not replace that evaluation, and the importance of remaining in the ED until their evaluation is complete.  Work-up initiated with labs to evaluate for abdominal pain as well as medical clearance for psychiatric evaluation for suicidal ideations   Kristen Porter, Dartha Lodge 06/27/21 1953

## 2021-06-27 NOTE — ED Triage Notes (Signed)
[facility] [place] [facility] [place] [facility] with c/o generalized abdominal pain, n/v and suicidal ideation with no plan. Recently relapsed with heroin use today. Recently out of detox.
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
## 2021-06-28 NOTE — ED Notes (Signed)
Patient alert this shift. Patient calm and cooperative.  Patient withdrawn and sleeping.  Denies suicidal and homicidal ideation.  Medication compliant.  Patient ate all meals,

## 2021-06-28 NOTE — ED Notes (Addendum)
Patient resting in room.

## 2021-06-28 NOTE — ED Notes (Signed)
Pt sleeping last COWS 6 pt medicated per order  and has returned to sleep. Vital signs assessed WNL and neuro check is WNL no distress of any kind noted pt verbally appropriate.

## 2021-06-28 NOTE — ED Provider Notes (Signed)
Emergency Medicine Observation Re-evaluation Note  Kristen Porter is a 31 y.o. female, seen on rounds today.  Pt initially presented to the ED for complaints of Suicidal Currently, the patient is sleeping.  Physical Exam  BP 125/80 (BP Location: Left Arm)    Pulse 81    Temp 97.7 F (36.5 C) (Oral)    Resp 18    Ht 5\' 6"  (1.676 m)    Wt 86.2 kg    LMP 06/08/2021 (Exact Date)    SpO2 99%    BMI 30.67 kg/m  Physical Exam General: Nondistressed Cardiac: Extremities well perfused Lungs: Breathing is even and unlabored Psych: Deferred  ED Course / MDM  EKG:EKG Interpretation  Date/Time:  Friday June 27 2021 19:41:21 EST Ventricular Rate:  53 PR Interval:  150 QRS Duration: 103 QT Interval:  419 QTC Calculation: 394 R Axis:   75 Text Interpretation: Sinus rhythm No acute changes No significant change since last tracing Confirmed by Varney Biles [phone]) on 06/27/2021 11:30:05 PM  I have reviewed the labs performed to date as well as medications administered while in observation.  Recent changes in the last 24 hours include patient presented to the ED last night with complaint of suicidal ideation.  She has history of polysubstance abuse with recent relapse.  She has been homeless.  She is medically cleared and awaiting psychiatric evaluation.  Plan  Current plan is for TTS evaluation.  Kristen Porter is not under involuntary commitment.     Godfrey Pick, MD 06/28/21 [phone]

## 2021-06-28 NOTE — BH Assessment (Signed)
Comprehensive Clinical Assessment (CCA) Note  06/28/2021 Kristen Porter ID:[id number]  DISPOSITION: Leevy-Johnson NP recommends patient be observed and monitored.   [place] ED from 06/27/2021 in [place] DEPT ED from 06/18/2021 in [place] ED from 03/21/2021 in [place] High Risk High Risk No Risk      The patient demonstrates the following risk factors for suicide: Chronic risk factors for suicide include: substance use disorder. Acute risk factors for suicide include: unemployment. Protective factors for this patient include: coping skills. Considering these factors, the overall suicide risk at this point appears to be high. Patient is not appropriate for outpatient follow up.   Patient is a 31 year old female that presents this date with passive S/I and an extensive history of SA issues. Patient per notes initially presented with S/I although at the time of assessment denies any specific plan or intent. Patient does report that she "doesn't just want to go on." Patient denies any H/I or AVH. Patient states she is from [facility] although states she is currently homeless. Patient reports that she has been using Heroin for over 5 years and recently attempted to addressed her recovery efforts by going through a 7 day detox program in [facility] 3 weeks ago although relapsed a few days after discharge stating she lacked any support system and "fell right back in with the wrong people." Patient reports daily use of one half a gram of heroin or more since then. Patient states her method of ingestion is nasally and denies any recent history of IV drug use although states she "has done that" over 5 years ago. Patient's UDS this date is positive for cocaine, opiates, amphetamines and THC. Patient denies any history of cocaine or amphetamine use stating "they must have been mixed  in with the heroin." Patient states they sporadically use THC although is vague in reference to time frame and amounts used. Patient states they did "smoke a joint" prior to arrival. Patient denies any other SA issues. Patient denies any prior mental health diagnosis or history of OP care associated with a mental health disorder. Patient does report frequent bouts of depression when she isn't using heroin with symptoms to include feeling hopeless and excessive fatigue. Patient reports current withdrawals to include slight nausea and generalized weakness.       Nanavati MD writes on arrival: 31 year old female comes in with chief complaint of suicidal ideation. She has history of polysubstance abuse, specifically heroin use. She is also reporting that she has history of depression that is untreated.   Few days ago she had left a rehab facility. She unfortunately relapsed. She was being dropped off to archive in [place] by her neighbor, they got into an altercation because that individual was acting "shady" -and so he dropped her off in [place] and return. Patient has been living on the street for the last week or so. She started using heroin again, including last use this morning. She started having thoughts of suicide, without a plan and ultimately decided to come to the ER to seek help.   She was also able to get in touch with her aunt, who was willing to drop her off to our, or any other rehab facility nearby.  Patient is observed to be drowsy although is oriented x 5. Patient speaks in allow soft voice that is difficult to understand at times. Patient renders limited history this date. Patient's  memory appears to be intact although thoughts somewhat disorganized. Patient's mood is depressed with affect congruent. Patient does not appear to be responding to internal stimuli.     Chief Complaint:  Chief Complaint  Patient presents with   Suicidal   Visit Diagnosis: Substance induced mood  disorder    CCA Screening, Triage and Referral (STR)  Patient Reported Information How did you hear about [place]? Self  What Is the Reason for Your Visit/Call Today? Pt presents with S/I and SA issues  How Long Has This Been Causing You Problems? > than 6 months  What Do You Feel Would Help You the Most Today? Alcohol or Drug Use Treatment   Have You Recently Had Any Thoughts About Hurting Yourself? Yes  Are You Planning to Commit Suicide/Harm Yourself At This time? No   Have you Recently Had Thoughts About [place]? No  Are You Planning to Harm Someone at This Time? No  Explanation: No data recorded  Have You Used Any Alcohol or Drugs in the Past 24 Hours? Yes  How Long Ago Did You Use Drugs or Alcohol? No data recorded What Did You Use and How Much? Pt states she used one half gram of Heroin prior to arrival   Do You Currently Have a Therapist/Psychiatrist? No  Name of Therapist/Psychiatrist: No data recorded  Have You Been Recently Discharged From Any Office Practice or Programs? No  Explanation of Discharge From Practice/Program: No data recorded    CCA Screening Triage Referral Assessment Type of Contact: Face-to-Face  Telemedicine Service Delivery:   Is this Initial or Reassessment? No data recorded Date Telepsych consult ordered in [facility]:  No data recorded Time Telepsych consult ordered in [facility]:  No data recorded Location of Assessment: [facility]  Provider Location: Other (comment) ([facility])   Collateral Involvement: None at this time   Does Patient Have a Hulbert? No data recorded Name and Contact of Legal Guardian: No data recorded If Minor and Not Living with Parent(s), Who has Custody? NA  Is CPS involved or ever been involved? Never  Is APS involved or ever been involved? Never   Patient Determined To Be At Risk for Harm To Self or Others Based on Review of Patient Reported Information or Presenting Complaint? Yes, for  Self-Harm  Method: No data recorded Availability of Means: No data recorded Intent: No data recorded Notification Required: No data recorded Additional Information for Danger to Others Potential: No data recorded Additional Comments for Danger to Others Potential: No data recorded Are There Guns or Other Weapons in Your Home? No data recorded Types of Guns/Weapons: No data recorded Are These Weapons Safely Secured?                            No data recorded Who Could Verify You Are Able To Have These Secured: No data recorded Do You Have any Outstanding Charges, Pending Court Dates, Parole/Probation? No data recorded Contacted To Inform of Risk of Harm To Self or Others: Other: Comment (NA)    Does Patient Present under Involuntary Commitment? No  IVC Papers Initial File Date: No data recorded  [place] of Residence: [place]   Patient Currently Receiving the Following Services: Not Receiving Services   Determination of Need: No data recorded  Options For Referral: Outpatient Therapy     CCA Biopsychosocial Patient Reported Schizophrenia/Schizoaffective Diagnosis in Past: No   Strengths: Pt is willing to participate in  treatment   Mental Health Symptoms Depression:   Change in energy/activity; Difficulty Concentrating; Hopelessness   Duration of Depressive symptoms:  Duration of Depressive Symptoms: Greater than two weeks   Mania:   N/A   Anxiety:    Difficulty concentrating; Irritability; Fatigue   Psychosis:   None   Duration of Psychotic symptoms:    Trauma:   None   Obsessions:   None   Compulsions:   None   Inattention:   None   Hyperactivity/Impulsivity:   None   Oppositional/Defiant Behaviors:   None   Emotional Irregularity:   Chronic feelings of emptiness; Intense/unstable relationships; Mood lability   Other Mood/Personality Symptoms:   NA    Mental Status Exam Appearance and self-care  Stature:   Average   Weight:    Average weight   Clothing:  No data recorded  Grooming:   Neglected   Cosmetic use:   None   Posture/gait:   Normal   Motor activity:   Not Remarkable   Sensorium  Attention:   Normal   Concentration:   Normal   Orientation:   X5   Recall/memory:   Normal   Affect and Mood  Affect:   Appropriate; Anxious   Mood:   Anxious; Depressed   Relating  Eye contact:   Fleeting   Facial expression:   Depressed   Attitude toward examiner:   Cooperative   Thought and Language  Speech flow:  Soft; Slow   Thought content:   Appropriate to Mood and Circumstances   Preoccupation:   None   Hallucinations:   None   Organization:  No data recorded  [employer] of Knowledge:   Fair   Intelligence:   Average   Abstraction:   Functional   Judgement:   Fair   Art therapist:   Adequate   Insight:   Fair   Decision Making:   Normal   Social Functioning  Social Maturity:   Responsible   Social Judgement:   Normal   Stress  Stressors:   Family conflict   Coping Ability:   Deficient supports   Skill Deficits:   Activities of daily living   Supports:   Usual     Religion: Religion/Spirituality Are You A Religious Person?: No  Leisure/Recreation: Leisure / Recreation Do You Have Hobbies?: No  Exercise/Diet: Exercise/Diet Do You Exercise?: No Do You Have Any Trouble Sleeping?: No   CCA Employment/Education Employment/Work Situation: Employment / Work Situation Employment Situation: Unemployed Has Patient ever Been in Passenger transport manager?: No  Education: Education Did Physicist, medical?: No Did You Have An Individualized Education Program (IIEP): No   CCA Family/Childhood History Family and Relationship History: Family history Marital status: Single Does patient have children?: No  Childhood History:  Childhood History Did patient suffer any verbal/emotional/physical/sexual abuse as a child?: No Did  patient suffer from severe childhood neglect?: No Has patient ever been sexually abused/assaulted/raped as an adolescent or adult?: No Was the patient ever a victim of a crime or a disaster?: No Witnessed domestic violence?: No Has patient been affected by domestic violence as an adult?: No  Child/Adolescent Assessment:     CCA Substance Use Alcohol/Drug Use: Alcohol / Drug Use Pain Medications: See MAR Prescriptions: See MAR Over the Counter: See MAR History of alcohol / drug use?: Yes Longest period of sobriety (when/how long): Intermittent over the last 5 years Negative Consequences of Use: Personal relationships Withdrawal Symptoms: Agitation, Tremors, Weakness, Cramps Substance #1 Name  of Substance 1: Heroin 1 - Age of First Use: 22 1 - Amount (size/oz): Varies usually one half a gram or more 1 - Frequency: Daily 1 - Duration: Last 5 years 1 - Last Use / Amount: Prior to arrival one half gram 1- Route of Use: Nasal Substance #2 Name of Substance 2: THC 2 - Age of First Use: 16 2 - Amount (size/oz): Varies 2 - Frequency: Daily 2 - Duration: Ongoing 2 - Last Use / Amount: Prior to arrival "a joint" 2 - Route of Substance Use: Smoked                     ASAM's:  Six Dimensions of Multidimensional Assessment  Dimension 1:  Acute Intoxication and/or Withdrawal Potential:   Dimension 1:  Description of individual's past and current experiences of substance use and withdrawal: 3  Dimension 2:  Biomedical Conditions and Complications:   Dimension 2:  Description of patient's biomedical conditions and  complications: 2  Dimension 3:  Emotional, Behavioral, or Cognitive Conditions and Complications:  Dimension 3:  Description of emotional, behavioral, or cognitive conditions and complications: 3  Dimension 4:  Readiness to Change:  Dimension 4:  Description of Readiness to Change criteria: 2  Dimension 5:  Relapse, Continued use, or Continued Problem Potential:   Dimension 5:  Relapse, continued use, or continued problem potential critiera description: 3  Dimension 6:  Recovery/Living Environment:  Dimension 6:  Recovery/Iiving environment criteria description: 3  ASAM Severity Score: ASAM's Severity Rating Score: 16  ASAM Recommended Level of Treatment:     Substance use Disorder (SUD) Substance Use Disorder (SUD)  Checklist Symptoms of Substance Use: Continued use despite having a persistent/recurrent physical/psychological problem caused/exacerbated by use  Recommendations for Services/Supports/Treatments:    Discharge Disposition:    DSM5 Diagnoses: There are no problems to display for this patient.    Referrals to Alternative Service(s): Referred to Alternative Service(s):   Place:   Date:   Time:    Referred to Alternative Service(s):   Place:   Date:   Time:    Referred to Alternative Service(s):   Place:   Date:   Time:    Referred to Alternative Service(s):   Place:   Date:   Time:     Mamie Nick, LCAS

## 2021-06-28 NOTE — ED Notes (Signed)
Patient's belongings ( 2 white belongings bags) placed in [place] [facility]

## 2021-06-29 ENCOUNTER — Other Ambulatory Visit (HOSPITAL_COMMUNITY)
Admission: EM | Admit: 2021-06-29 | Discharge: 2021-06-29 | Disposition: A | Discharge status: Home / Self Care | Payer: No Payment, Other | Attending: Nurse Practitioner | Admitting: Nurse Practitioner

## 2021-06-29 DIAGNOSIS — R451 Restlessness and agitation: Secondary | ICD-10-CM | POA: Insufficient documentation

## 2021-06-29 DIAGNOSIS — Z5900 Homelessness unspecified: Secondary | ICD-10-CM | POA: Diagnosis not present

## 2021-06-29 DIAGNOSIS — R4587 Impulsiveness: Secondary | ICD-10-CM | POA: Insufficient documentation

## 2021-06-29 DIAGNOSIS — Z9151 Personal history of suicidal behavior: Secondary | ICD-10-CM | POA: Insufficient documentation

## 2021-06-29 DIAGNOSIS — F1910 Other psychoactive substance abuse, uncomplicated: Secondary | ICD-10-CM | POA: Diagnosis not present

## 2021-06-29 DIAGNOSIS — R454 Irritability and anger: Secondary | ICD-10-CM | POA: Insufficient documentation

## 2021-06-29 DIAGNOSIS — G479 Sleep disorder, unspecified: Secondary | ICD-10-CM | POA: Insufficient documentation

## 2021-06-29 DIAGNOSIS — Z62810 Personal history of physical and sexual abuse in childhood: Secondary | ICD-10-CM | POA: Insufficient documentation

## 2021-06-29 DIAGNOSIS — Z596 Low income: Secondary | ICD-10-CM | POA: Insufficient documentation

## 2021-06-29 DIAGNOSIS — Z560 Unemployment, unspecified: Secondary | ICD-10-CM | POA: Insufficient documentation

## 2021-06-29 DIAGNOSIS — R45851 Suicidal ideations: Secondary | ICD-10-CM | POA: Insufficient documentation

## 2021-06-29 DIAGNOSIS — F1190 Opioid use, unspecified, uncomplicated: Secondary | ICD-10-CM | POA: Insufficient documentation

## 2021-06-29 DIAGNOSIS — F32A Depression, unspecified: Secondary | ICD-10-CM | POA: Insufficient documentation

## 2021-06-29 DIAGNOSIS — F419 Anxiety disorder, unspecified: Secondary | ICD-10-CM | POA: Insufficient documentation

## 2021-06-29 DIAGNOSIS — F1290 Cannabis use, unspecified, uncomplicated: Secondary | ICD-10-CM | POA: Insufficient documentation

## 2021-06-29 MED ORDER — LOPERAMIDE HCL 2 MG PO CAPS
2.0000 mg | ORAL_CAPSULE | ORAL | Status: DC | PRN
  Filled 2021-06-29: qty 1

## 2021-06-29 MED ORDER — ONDANSETRON 4 MG PO TBDP: 4.0000 mg | ORAL_TABLET | Freq: Four times a day (QID) | ORAL | Status: DC | PRN

## 2021-06-29 MED ORDER — ALUM & MAG HYDROXIDE-SIMETH 200-200-20 MG/5ML PO SUSP: 30.0000 mL | ORAL | Status: DC | PRN

## 2021-06-29 MED ORDER — HYDROXYZINE HCL 25 MG PO TABS
25.0000 mg | ORAL_TABLET | Freq: Four times a day (QID) | ORAL | Status: DC | PRN
  Administered 2021-06-29: 25 mg via ORAL
  Filled 2021-06-29: qty 1

## 2021-06-29 MED ORDER — DICYCLOMINE HCL 20 MG PO TABS
20.0000 mg | ORAL_TABLET | Freq: Four times a day (QID) | ORAL | Status: DC | PRN
  Administered 2021-06-29: 20 mg via ORAL
  Filled 2021-06-29: qty 1

## 2021-06-29 MED ORDER — TRAZODONE HCL 50 MG PO TABS: 50.0000 mg | ORAL_TABLET | Freq: Every evening | ORAL | Status: DC | PRN

## 2021-06-29 MED ORDER — ACETAMINOPHEN 325 MG PO TABS
650.0000 mg | ORAL_TABLET | Freq: Four times a day (QID) | ORAL | Status: DC | PRN
  Administered 2021-06-29: 650 mg via ORAL
  Filled 2021-06-29: qty 2

## 2021-06-29 MED ORDER — CLONIDINE HCL 0.1 MG PO TABS: 0.1000 mg | ORAL_TABLET | Freq: Every day | ORAL | Status: DC

## 2021-06-29 MED ORDER — METHOCARBAMOL 500 MG PO TABS
500.0000 mg | ORAL_TABLET | Freq: Three times a day (TID) | ORAL | Status: DC | PRN
  Administered 2021-06-29: 500 mg via ORAL
  Filled 2021-06-29: qty 1

## 2021-06-29 MED ORDER — CLONIDINE HCL 0.1 MG PO TABS
0.1000 mg | ORAL_TABLET | Freq: Four times a day (QID) | ORAL | Status: DC
  Administered 2021-06-29 (×2): 0.1 mg via ORAL
  Filled 2021-06-29 (×2): qty 1

## 2021-06-29 MED ORDER — MAGNESIUM HYDROXIDE 400 MG/5ML PO SUSP: 30.0000 mL | Freq: Every day | ORAL | Status: DC | PRN

## 2021-06-29 MED ORDER — CLONIDINE HCL 0.1 MG PO TABS: 0.1000 mg | ORAL_TABLET | ORAL | Status: DC

## 2021-06-29 NOTE — Progress Notes (Signed)
Patient came to nursing station and stated she wanted to leave the facility as she is experiencing opiate withdrawal and comfort meds are not helping.  She wants suboxone.  Rodell Perna, NP made aware.  RN attempted to encourage patient to take a hot shower which might alleviate her symptoms however she does not want to .  Patient informed that NP will see here as soon as possible.  She was dissatisfied with this as she wanted to leave immediately.  She did accept it though and has gone back to her room to wait.

## 2021-06-29 NOTE — ED Notes (Signed)
Pt eating lunch

## 2021-06-29 NOTE — ED Notes (Signed)
Pt is requesting to be discharged. "I am ready to go". Pt is becoming agitated on the unit,pt yelled in frustration and is pacing back and forth. Pt comforted by RN. Pt encouraged to utilize deep breathing as a coping skill. Pt complied with the deep breathing and is currently sitting on the couch by the telephone area. NP notified.

## 2021-06-29 NOTE — ED Notes (Signed)
Patient is presently asleep in bed.  No complaints at present.  No evidence of opiate withdrawal at this time.  Will monitor and assess per COWS protocol.

## 2021-06-29 NOTE — ED Provider Notes (Signed)
FBC/OBS ASAP Discharge Summary  Date and Time: 06/29/2021 3:10 PM  Name: Kristen Porter  MRN:  [id number]   Discharge Diagnoses:  Final diagnoses:  Polysubstance abuse [facility](HCC)    Subjective: Patient seen and evaluated face-to-face by this provider, chart reviewed and case discussed with Dr. Gasper Sellsinderella. On evaluation, patient is alert and oriented x4. Her thought process is logical. Her speech is clear and coherent at an increased tone. She is noted to be irritable and easily agitated. She states that she has been waiting on the afternoon to be discharged. She states that she wants to go to a facility that prescribes Suboxone. She states that her aunt can take her to a facility that prescribed Suboxone. She describes her withdrawal symptoms as unable to sit still, and having hot and cold flashes. She is noted to be fidgety during the assessment. She denies suicidal and homicidal ideations. She states that she does not want to kill herself. She denies auditory and visual hallucinations. There is no evidence that she is responding to internal or external stimuli.  Stay Summary: Kristen SkillernBrittany Porter is a 31 year old single female unemployed homeless [facility]. Patient reports that she presented to ED after being on the streets for several days snorting heroin and feeling suicidal. [facility]. Patient was started on a clonidine taper for opiate withdrawals. Patient became agitated this afternoon requesting to be discharged immediately. Patient states that she is looking for a facility that prescribe Suboxone. Patient states that she wants to leave and if we do not let her out she will walk out on her own and does not care if the police is called. Patient agrees to follow up with [facility] substance abuse treatment. Patient denies SI/HI/AVH. Patient does not meet criteria for involuntary  commitment.  Total Time spent with patient: 15 minutes  Past Psychiatric History: polysubstance abuse  Past Medical History:  Past Medical History:  Diagnosis Date   Asthma     Past Surgical History:  Procedure Laterality Date   CESAREAN SECTION     CHOLECYSTECTOMY     DILATION AND CURETTAGE OF UTERUS     Family History: No family history on file. Family Psychiatric History: No hx reported  Social History:  Social History   Substance and Sexual Activity  Alcohol Use Not Currently     Social History   Substance and Sexual Activity  Drug Use Yes   Types: Methamphetamines, Marijuana   Comment: snorts heroin    Social History   Socioeconomic History   Marital status: Single    Spouse name: Not on file   Number of children: Not on file   Years of education: Not on file   Highest education level: Not on file  Occupational History   Not on file  Tobacco Use   Smoking status: Every Day    Types: Cigarettes   Smokeless tobacco: Not on file  Vaping Use   Vaping Use: Never used  Substance and Sexual Activity   Alcohol use: Not Currently   Drug use: Yes    Types: Methamphetamines, Marijuana    Comment: snorts heroin   Sexual activity: Not on file  Other Topics Concern   Not on file  Social History Narrative   Not on file   Social Determinants of Health   Financial Resource Strain: Not on file  Food Insecurity: Not on file  Transportation Needs:  Not on file  Physical Activity: Not on file  Stress: Not on file  Social Connections: Not on file   SDOH:  SDOH Screenings   Alcohol Screen: Not on file  Depression (PHQ2-9): Not on file  Financial Resource Strain: Not on file  Food Insecurity: Not on file  Housing: Not on file  Physical Activity: Not on file  Social Connections: Not on file  Stress: Not on file  Tobacco Use: High Risk   Smoking Tobacco Use: Every Day   Smokeless Tobacco Use: Unknown   Passive Exposure: Not on file  Transportation Needs:  Not on file    Tobacco Cessation:  Prescription not provided because: pt declined   Current Medications:  Current Facility-Administered Medications  Medication Dose Route Frequency Provider Last Rate Last Admin   acetaminophen (TYLENOL) tablet 650 mg  650 mg Oral Q6H PRN Bobbitt, Shalon E, NP   650 mg at 06/29/21 1109   alum & mag hydroxide-simeth (MAALOX/MYLANTA) 200-200-20 MG/5ML suspension 30 mL  30 mL Oral Q4H PRN Bobbitt, Shalon E, NP       cloNIDine (CATAPRES) tablet 0.1 mg  0.1 mg Oral QID Bobbitt, Shalon E, NP   0.1 mg at 06/29/21 1359   Followed by   Melene Muller ON 07/01/2021] cloNIDine (CATAPRES) tablet 0.1 mg  0.1 mg Oral BH-qamhs Bobbitt, Shalon E, NP       Followed by   Melene Muller ON 07/04/2021] cloNIDine (CATAPRES) tablet 0.1 mg  0.1 mg Oral QAC breakfast Bobbitt, Shalon E, NP       dicyclomine (BENTYL) tablet 20 mg  20 mg Oral Q6H PRN Bobbitt, Shalon E, NP   20 mg at 06/29/21 1110   hydrOXYzine (ATARAX) tablet 25 mg  25 mg Oral Q6H PRN Bobbitt, Shalon E, NP   25 mg at 06/29/21 1110   loperamide (IMODIUM) capsule 2-4 mg  2-4 mg Oral PRN Bobbitt, Shalon E, NP       magnesium hydroxide (MILK OF MAGNESIA) suspension 30 mL  30 mL Oral Daily PRN Bobbitt, Shalon E, NP       methocarbamol (ROBAXIN) tablet 500 mg  500 mg Oral Q8H PRN Bobbitt, Shalon E, NP   500 mg at 06/29/21 1110   ondansetron (ZOFRAN-ODT) disintegrating tablet 4 mg  4 mg Oral Q6H PRN Bobbitt, Shalon E, NP       traZODone (DESYREL) tablet 50 mg  50 mg Oral QHS PRN Bobbitt, Shalon E, NP       No current outpatient medications on file.    PTA Medications: (Not in a hospital admission)   Musculoskeletal  Strength & Muscle Tone: within normal limits Gait & Station: normal Patient leans: N/A  Psychiatric Specialty Exam  Presentation  General Appearance: Appropriate for Environment  Eye Contact:Fair  Speech:Clear and Coherent  Speech Volume:Normal  Handedness:Right   Mood and Affect   Mood:Irritable  Affect:Congruent   Thought Process  Thought Processes:Coherent  Descriptions of Associations:Intact  Orientation:Full (Time, Place and Person)  Thought Content:Logical  Diagnosis of Schizophrenia or Schizoaffective disorder in past: No    Hallucinations:Hallucinations: None  Ideas of Reference:None  Suicidal Thoughts:Suicidal Thoughts: No  Homicidal Thoughts:Homicidal Thoughts: No   Sensorium  Memory:Immediate Fair; Remote Fair; Recent Fair  Judgment:Fair  Insight:Fair   Executive Functions  Concentration:Fair  Attention Span:Fair  Recall:Fair  Fund of Knowledge:Fair  Language:Fair   Psychomotor Activity  Psychomotor Activity:Psychomotor Activity: Normal   Assets  Assets:Communication Skills; Desire for Improvement; Physical Health; Leisure Time; Social Support   Sleep  Sleep:Sleep: Poor Number of Hours of Sleep: -1   Nutritional Assessment (For OBS and FBC admissions only) Has the patient had a weight loss or gain of 10 pounds or more in the last 3 months?: No Has the patient had a decrease in food intake/or appetite?: No Does the patient have dental problems?: No Does the patient have eating habits or behaviors that may be indicators of an eating disorder including binging or inducing vomiting?: No Has the patient recently lost weight without trying?: 0 Has the patient been eating poorly because of a decreased appetite?: 1 Malnutrition Screening Tool Score: 1    Physical Exam  Physical Exam Constitutional:      Appearance: Normal appearance.  HENT:     Head: Normocephalic and atraumatic.     Nose: Nose normal.  Eyes:     Conjunctiva/sclera: Conjunctivae normal.  Cardiovascular:     Rate and Rhythm: Normal rate.  Musculoskeletal:        General: Normal range of motion.     Cervical back: Normal range of motion.  Neurological:     Mental Status: She is alert and oriented to person, place, and time.   Review of  Systems  Constitutional: Negative.   HENT: Negative.    Eyes: Negative.   Respiratory: Negative.    Cardiovascular: Negative.   Gastrointestinal: Negative.   Genitourinary: Negative.   Musculoskeletal: Negative.   Neurological: Negative.   Endo/Heme/Allergies: Negative.   Blood pressure 100/80, pulse 68, temperature 97.9 F (36.6 C), temperature source Oral, resp. rate 18, last menstrual period 06/08/2021, SpO2 100 %. There is no height or weight on file to calculate BMI.  Demographic Factors:  Caucasian, Low socioeconomic status, and Unemployed  Loss Factors: Financial problems/change in socioeconomic status  Historical Factors: Impulsivity  Risk Reduction Factors:   Sense of responsibility to family and Positive social support  Continued Clinical Symptoms:  Alcohol/Substance Abuse/Dependencies  Cognitive Features That Contribute To Risk:  None    Suicide Risk:  Minimal: No identifiable suicidal ideation.  Patients presenting with no risk factors but with morbid ruminations; may be classified as minimal risk based on the severity of the depressive symptoms  Plan Of Care/Follow-up recommendations:  Activity:  as tolerated  Disposition: Discharge to self/home.  Follow up recommendations:   Follow-up Information     Go to  [facility].   Why: to establish substance abuse treatment Contact information: [address], [address] [place] [address]        Call  [facility].   Why: Please contact if you are interested in any outpatient substance abuse services. Contact information: [address] [place], [place] [address]  Office: [phone]  Fax: [phone]                Layla Barter, NP 06/29/2021, 3:10 PM

## 2021-06-29 NOTE — ED Notes (Signed)
Pt given worksheet to complete for group. The activity consisted of creating a goal of the day associated with self-care. °

## 2021-06-29 NOTE — ED Notes (Signed)
Pt transport to [facility] via safe transport with NT present
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
## 2021-06-29 NOTE — ED Notes (Signed)
Pt offered breakfast;declined. 

## 2021-06-29 NOTE — Progress Notes (Signed)
Patient increasingly agitated and wanting discharge.  Patient encouraged to practice deep breathing, take a hot shower and to practice coping skills.  NP made aware again about patients desire for discharge, her increasing opiate withdrawal signs and her frustration.  Patient yelled out in frustration.  RN attempting to keep patient calm until she is seen by provider.  However, she is escalating.  Will continue to attempt to provide support.
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
## 2021-06-29 NOTE — Progress Notes (Signed)
Patient complained of opiate withdrawal symptoms of sweating, mild diahrea with stomach cramping, diffuse joint pain and muscle cramping. Her skin is moist however no dilated pupils or piloerection.  Patient denies vomiting but endorses nausea.  COWS = 10 at this time. PO PRN opiate comfort medication given.  Awaiting result.  RN also suggested she increase PO fluid of water and if needed to take a hot shower to help with joint pain and muscle cramping.  Support given.  Will monitor for worsening opiate withdrawal symptoms.

## 2021-06-29 NOTE — ED Notes (Signed)
Pt eating breakfast 

## 2021-06-29 NOTE — Progress Notes (Signed)
Patient was in acute opiate withdrawal and the comfort medications we were providing were not helping with symptom relief.  Patient strongly requested discharge.  Once NP was able to see patient she was discharged from system and facility.  RN explained to patient the importance of being careful of potential overdose if she uses opiates now.  Patient expressed her understanding of the potential for overdose as she has not used in several days. Patient given a bus pass and she ambulated out of the facility.  Denied avh shi or plan.

## 2021-06-29 NOTE — Discharge Instructions (Addendum)

## 2021-06-29 NOTE — Progress Notes (Signed)
Patient continues to experience opiate withdrawal symptoms and is uncomfortable when awake.  She is presently asleep right now.  Will continue to monitor.

## 2021-07-15 ENCOUNTER — Telehealth (HOSPITAL_COMMUNITY): Payer: Self-pay

## 2021-07-15 NOTE — BH Assessment (Signed)
Care Management - [place] Follow Up Discharges   Writer attempted to make contact with patient today and was unsuccessful.  Writer left a HIPPA compliant voice message.   Per chart review, patient is homeless and was provided with outpatient substance abuse resources.

## 2022-01-24 ENCOUNTER — Other Ambulatory Visit: Payer: Self-pay

## 2022-01-24 ENCOUNTER — Emergency Department (HOSPITAL_COMMUNITY)
Admission: EM | Admit: 2022-01-24 | Discharge: 2022-01-24 | Disposition: A | Discharge status: Home / Self Care | Payer: 59 | Attending: Emergency Medicine | Admitting: Emergency Medicine

## 2022-01-24 DIAGNOSIS — F419 Anxiety disorder, unspecified: Secondary | ICD-10-CM | POA: Insufficient documentation

## 2022-01-24 DIAGNOSIS — F1910 Other psychoactive substance abuse, uncomplicated: Secondary | ICD-10-CM | POA: Diagnosis present

## 2022-01-24 LAB — RAPID URINE DRUG SCREEN, HOSP PERFORMED
Amphetamines: POSITIVE — AB
Barbiturates: NOT DETECTED
Benzodiazepines: NOT DETECTED
Cocaine: NOT DETECTED
Opiates: NOT DETECTED
Tetrahydrocannabinol: POSITIVE — AB

## 2022-01-24 NOTE — ED Provider Notes (Signed)
[facility]-EMERGENCY DEPT Provider Note   CSN: [id number] Arrival date & time: 01/24/22  1243     History  Chief Complaint  Patient presents with   Drug Problem    Kristen Porter is a 31 y.o. female.  31 year old female presents requesting help for heroin use.  Patient states that her last use was yesterday and she states that she snorts it.  She also took an ecstasy tablet.  States that she has anxiety attacks after she uses the substances.  Denies any medical complaints such as fever, injection site issues.  Has been in rehab before in the past but not currently.  Denies any suicidal or homicidal intent       Home Medications Prior to Admission medications   Not on File      Allergies    Morphine and related, Toradol [ketorolac tromethamine], and Tramadol    Review of Systems   Review of Systems  All other systems reviewed and are negative.   Physical Exam Updated Vital Signs BP (!) 137/98 (BP Location: Right Arm)   Pulse 78   Resp 16   SpO2 98%  Physical Exam Vitals and nursing note reviewed.  Constitutional:      General: She is not in acute distress.    Appearance: Normal appearance. She is well-developed. She is not toxic-appearing.  HENT:     Head: Normocephalic and atraumatic.  Eyes:     General: Lids are normal.     Conjunctiva/sclera: Conjunctivae normal.     Pupils: Pupils are equal, round, and reactive to light.  Neck:     Thyroid: No thyroid mass.     Trachea: No tracheal deviation.  Cardiovascular:     Rate and Rhythm: Normal rate and regular rhythm.     Heart sounds: Normal heart sounds. No murmur heard.    No gallop.  Pulmonary:     Effort: Pulmonary effort is normal. No respiratory distress.     Breath sounds: Normal breath sounds. No stridor. No decreased breath sounds, wheezing, rhonchi or rales.  Abdominal:     General: There is no distension.     Palpations: Abdomen is soft.     Tenderness: There is no  abdominal tenderness. There is no rebound.  Musculoskeletal:        General: No tenderness. Normal range of motion.     Cervical back: Normal range of motion and neck supple.  Skin:    General: Skin is warm and dry.     Findings: No abrasion or rash.  Neurological:     Mental Status: She is alert and oriented to person, place, and time. Mental status is at baseline.     GCS: GCS eye subscore is 4. GCS verbal subscore is 5. GCS motor subscore is 6.     Cranial Nerves: No cranial nerve deficit.     Sensory: No sensory deficit.     Motor: Motor function is intact.  Psychiatric:        Attention and Perception: Attention normal.        Speech: Speech normal.        Behavior: Behavior normal.        Thought Content: Thought content does not include homicidal or suicidal ideation.     ED Results / Procedures / Treatments   Labs (all labs ordered are listed, but only abnormal results are displayed) Labs Reviewed  RAPID URINE DRUG SCREEN, HOSP PERFORMED    EKG None  Radiology  No results found.  Procedures Procedures    Medications Ordered in ED Medications - No data to display  ED Course/ Medical Decision Making/ A&P                           Medical Decision Making Amount and/or Complexity of Data Reviewed Labs: ordered.  Patient here for polysubstance abuse.  She has no sign of acute psychiatric disorder at this time.  Her vital signs were recently stable.  Does not require inpatient medical hospitalization.  Will provide patient with community resources.        Final Clinical Impression(s) / ED Diagnoses Final diagnoses:  None    Rx / DC Orders ED Discharge Orders     None         Lorre Nick, MD 01/24/22 1352

## 2022-01-24 NOTE — ED Triage Notes (Signed)
PT BIBA from street.  Pt states they snorted some heroin and took 2x ecstasy pills. Pt reports feeling very anxious, Pt states that they want to get help for their problem.  Aox4  BP: 150/110 HR: 120 SPO2: 98 RA CBG: 180

## 2022-02-12 ENCOUNTER — Emergency Department (HOSPITAL_COMMUNITY): Payer: 59

## 2022-02-12 ENCOUNTER — Other Ambulatory Visit: Payer: Self-pay

## 2022-02-12 ENCOUNTER — Emergency Department (HOSPITAL_COMMUNITY)
Admission: EM | Admit: 2022-02-12 | Discharge: 2022-02-12 | Disposition: A | Discharge status: Home / Self Care | Payer: 59 | Attending: Emergency Medicine | Admitting: Emergency Medicine

## 2022-02-12 ENCOUNTER — Encounter (HOSPITAL_COMMUNITY): Payer: Self-pay

## 2022-02-12 DIAGNOSIS — F1910 Other psychoactive substance abuse, uncomplicated: Secondary | ICD-10-CM

## 2022-02-12 DIAGNOSIS — J45909 Unspecified asthma, uncomplicated: Secondary | ICD-10-CM | POA: Insufficient documentation

## 2022-02-12 DIAGNOSIS — R0602 Shortness of breath: Secondary | ICD-10-CM | POA: Diagnosis not present

## 2022-02-12 DIAGNOSIS — W01198A Fall on same level from slipping, tripping and stumbling with subsequent striking against other object, initial encounter: Secondary | ICD-10-CM | POA: Diagnosis not present

## 2022-02-12 DIAGNOSIS — Y9259 Other trade areas as the place of occurrence of the external cause: Secondary | ICD-10-CM | POA: Insufficient documentation

## 2022-02-12 DIAGNOSIS — S299XXA Unspecified injury of thorax, initial encounter: Secondary | ICD-10-CM | POA: Diagnosis present

## 2022-02-12 DIAGNOSIS — S20212A Contusion of left front wall of thorax, initial encounter: Secondary | ICD-10-CM | POA: Insufficient documentation

## 2022-02-12 LAB — BASIC METABOLIC PANEL
Anion gap: 9 (ref 5–15)
BUN: 15 mg/dL (ref 6–20)
CO2: 23 mmol/L (ref 22–32)
Calcium: 8.9 mg/dL (ref 8.9–10.3)
Chloride: 104 mmol/L (ref 98–111)
Creatinine, Ser: 0.78 mg/dL (ref 0.44–1.00)
GFR, Estimated: >60 mL/min (ref >60)
Glucose, Bld: 90 mg/dL (ref 70–99)
Potassium: 4.8 mmol/L (ref 3.5–5.1)
Sodium: 136 mmol/L (ref 135–145)

## 2022-02-12 LAB — BRAIN NATRIURETIC PEPTIDE: B Natriuretic Peptide: 21.9 pg/mL (ref 0.0–100.0)

## 2022-02-12 LAB — I-STAT BETA HCG BLOOD, ED (MC, WL, AP ONLY): I-stat hCG, quantitative: <5 m[IU]/mL (ref <5)

## 2022-02-12 LAB — CBC WITH DIFFERENTIAL/PLATELET
Abs Immature Granulocytes: 0.01 10*3/uL (ref 0.00–0.07)
Basophils Absolute: 0 10*3/uL (ref 0.0–0.1)
Basophils Relative: 0 %
Eosinophils Absolute: 0.2 10*3/uL (ref 0.0–0.5)
Eosinophils Relative: 3 %
HCT: 40.1 % (ref 36.0–46.0)
Hemoglobin: 12.5 g/dL (ref 12.0–15.0)
Immature Granulocytes: 0 %
Lymphocytes Relative: 32 %
Lymphs Abs: 1.9 10*3/uL (ref 0.7–4.0)
MCH: 28 pg (ref 26.0–34.0)
MCHC: 31.2 g/dL (ref 30.0–36.0)
MCV: 89.9 fL (ref 80.0–100.0)
Monocytes Absolute: 0.5 10*3/uL (ref 0.1–1.0)
Monocytes Relative: 8 %
Neutro Abs: 3.4 10*3/uL (ref 1.7–7.7)
Neutrophils Relative %: 57 %
Platelets: 215 10*3/uL (ref 150–400)
RBC: 4.46 MIL/uL (ref 3.87–5.11)
RDW: 13.6 % (ref 11.5–15.5)
WBC: 6 10*3/uL (ref 4.0–10.5)
nRBC: 0 % (ref 0.0–0.2)

## 2022-02-12 LAB — TROPONIN I (HIGH SENSITIVITY)
Troponin I (High Sensitivity): 3 ng/L (ref <18)
Troponin I (High Sensitivity): 4 ng/L (ref <18)
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
## 2022-02-12 NOTE — Discharge Instructions (Addendum)
You were seen in the emergency department with a chest injury.  As we discussed the x-ray of your chest did not show any broken ribs, and your blood work looked normal.
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
## 2022-03-31 ENCOUNTER — Ambulatory Visit (HOSPITAL_COMMUNITY)
Admission: AD | Admit: 2022-03-31 | Discharge: 2022-03-31 | Disposition: A | Discharge status: Home / Self Care | Payer: 59 | Attending: Psychiatry | Admitting: Psychiatry

## 2022-03-31 DIAGNOSIS — Z1152 Encounter for screening for COVID-19: Secondary | ICD-10-CM | POA: Insufficient documentation

## 2022-03-31 DIAGNOSIS — F319 Bipolar disorder, unspecified: Secondary | ICD-10-CM | POA: Diagnosis not present

## 2022-03-31 DIAGNOSIS — F1210 Cannabis abuse, uncomplicated: Secondary | ICD-10-CM

## 2022-03-31 DIAGNOSIS — F1910 Other psychoactive substance abuse, uncomplicated: Secondary | ICD-10-CM | POA: Diagnosis present

## 2022-03-31 DIAGNOSIS — Z91148 Patient's other noncompliance with medication regimen for other reason: Secondary | ICD-10-CM

## 2022-03-31 DIAGNOSIS — Z5900 Homelessness unspecified: Secondary | ICD-10-CM

## 2022-03-31 NOTE — H&P (Signed)
Behavioral Health Medical Screening Exam  Kristen Porter is a 31 y.o. female.  With a history of substance abuse disorder depression, bipolar disorder and mood disorder.  Presented to [facility] voluntarily.  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 Per the patient she last used fentanyl tonight before coming in she also used marijuana today when asked how she administered a fentanyl patient states she snorts it.  According to patient she has been homeless for 1 year however she just moved in with her mom and that is one of the requirement for her to stay with her mom is to get to treatment.  Patient reported she has 4 children who are currently adopted however she does have contact with them.  Patient is unemployed at this time.  According to patient she was taking medication however she stopped taking the medicine after she started feeling better.  Patient does not currently see a psychiatrist or a therapist.    Face-to-face observation of patient, patient is alert and oriented x 4, speech is clear.  Maintain eye contact.  Mood is anxious, fidgety, pt.  Patient denies SI, HI, AVH or paranoia.  Pt denies alcohol use.  Denies having access to guns or weapon.    Pt will be transfer to [facility],  spoke with [place] NP at [facility] who is accepting the patient,  also [facility] was also notify and accept the patient for observation and possibly [facility].    Total Time spent with patient: 20 minutes  Psychiatric Specialty Exam:  Presentation  General Appearance:  Casual  Eye Contact: Fair  Speech: Clear and Coherent  Speech Volume: Normal  Handedness: Right   Mood and Affect  Mood: Anxious  Affect: Congruent   Thought Process  Thought Processes: Coherent  Descriptions of Associations:Intact  Orientation:Full (Time, Place and Person)  Thought Content:WDL  History of Schizophrenia/Schizoaffective disorder:No  Duration  of Psychotic Symptoms:No data recorded Hallucinations:Hallucinations: None  Ideas of Reference:None  Suicidal Thoughts:Suicidal Thoughts: No  Homicidal Thoughts:Homicidal Thoughts: No   Sensorium  Memory: Immediate Fair  Judgment: Fair  Insight: Fair   Executive Functions  Concentration: Good  Attention Span: Good  Recall: Good  Fund of Knowledge: Good  Language: Good   Psychomotor Activity  Psychomotor Activity:Psychomotor Activity: Normal   Assets  Assets: Communication Skills; Desire for Improvement; Social Support   Sleep  Sleep:Sleep: Poor    Physical Exam: Physical Exam HENT:     Head: Normocephalic.     Nose: Nose normal.  Cardiovascular:     Rate and Rhythm: Normal rate.  Pulmonary:     Effort: Pulmonary effort is normal.  Musculoskeletal:        General: Normal range of motion.     Cervical back: Normal range of motion.  Neurological:     General: No focal deficit present.     Mental Status: She is alert.  Psychiatric:        Mood and Affect: Mood normal.        Behavior: Behavior normal.        Thought Content: Thought content normal.        Judgment: Judgment normal.    Review of Systems  Constitutional: Negative.   HENT: Negative.    Eyes: Negative.   Respiratory: Negative.    Cardiovascular: Negative.   Gastrointestinal: Negative.   Genitourinary: Negative.   Musculoskeletal: Negative.   Skin: Negative.   Neurological: Negative.  Endo/Heme/Allergies: Negative.   Psychiatric/Behavioral:  Positive for depression and substance abuse. The patient is nervous/anxious.    There were no vitals taken for this visit. There is no height or weight on file to calculate BMI.  Musculoskeletal: Strength & Muscle Tone: within normal limits Gait & Station: normal Patient leans: N/A  [place] Scale:  Flowsheet Row OP Visit from 03/31/2022 in [place] ED from 02/12/2022 in [place] ED from 01/24/2022 in [place] DEPT  C-SSRS RISK CATEGORY No Risk No Risk No Risk       Recommendations:  Based on my evaluation the patient appears to have an emergency medical condition for which I recommend the patient be transferred to the emergency department for further evaluation.  Evette Georges, NP 04/01/2022, 5:49 AM

## 2022-04-01 ENCOUNTER — Ambulatory Visit (HOSPITAL_COMMUNITY)
Admission: EM | Admit: 2022-04-01 | Discharge: 2022-04-01 | Disposition: A | Discharge status: Home / Self Care | Payer: 59

## 2022-04-01 DIAGNOSIS — F1990 Other psychoactive substance use, unspecified, uncomplicated: Secondary | ICD-10-CM

## 2022-04-01 DIAGNOSIS — F1210 Cannabis abuse, uncomplicated: Secondary | ICD-10-CM | POA: Insufficient documentation

## 2022-04-01 DIAGNOSIS — Z5900 Homelessness unspecified: Secondary | ICD-10-CM | POA: Insufficient documentation

## 2022-04-01 DIAGNOSIS — Z91148 Patient's other noncompliance with medication regimen for other reason: Secondary | ICD-10-CM | POA: Insufficient documentation

## 2022-04-01 LAB — SARS CORONAVIRUS 2 BY RT PCR: SARS Coronavirus 2 by RT PCR: NEGATIVE

## 2022-04-01 NOTE — Discharge Instructions (Signed)

## 2022-04-01 NOTE — ED Provider Notes (Signed)
Behavioral Health Urgent Care Medical Screening Exam  Patient Name: Kristen Porter MRN: [id number] Date of Evaluation: 04/01/22 Chief Complaint: "I need outpatient SA resources" Diagnosis:  Final diagnoses:  Substance use disorder    History of Present illness: Kristen Porter is a 31 y.o. female. with psychiatric history of substance abuse disorder, depression, bipolar disorder and mood disorder.  Patient Presented to [facility] as a transfer from [facility] voluntarily seeking Suboxone treatment.   Pt reports " I was riding around town with my mom in her car and we was talking about me getting clean since I just moved in with her and she said we should go to the [facility] and get resources for substance abuse treatment". Patient reports "I've come here before and they don't do Suboxone, and its my fault because I could have gone to [facility], but I'm just looking for a place I could go outpatient and take classes during the week and care for my mental health because of my panic attacks".   Patient denies SI/HI/AVH or paranoia.  Pt reports she snorts fentanyl daily and used last yesterday. Patient reports she uses "a little amount". Pt reports she also smokes marijuana  and last used yesterday.  Patient denies other illicit drug use.   Patient reports past treatment/detox at [facility] in [place] last christmas for 5 days, and was discharged on Suboxone. Pt reports she was clean from December to march when she relapsed on fentanyl March 11.   Pt reports she does not currently see a therapist or psychiatrist and is not on any prescribed psychiatric medications. Pt is unable to recall when she last took Suboxone either prescribed or from the streets. According to patient she was taking medications however she stopped taking the medicine after she started feeling better and relapsed.    Patient reports being homeless the past year, but recently moved in with her mom who relocated to [place].  Patient  reports "now that mom has moved to [place], great resources are available, and she has a nice car and nice house, and she wants me to get clean to be able to live with her. Patient denies access or means to gun or weapon.  Patient reported she has 4 children who are currently adopted and she does have contact with them.  Patient reports she is unemployed.  Patient reports her sleep as poor and appetite as good.  Support, encouragement and reassurance provided about ongoing stressors.  Patient provided with opportunity for questions.  Patient is requesting for outpatient substance abuse treatment resources.  On evaluation, patient is alert, oriented x 3, and cooperative. Speech is clear and coherent. Pt appears casual. Eye contact is fair. Mood is anxious, affect is congruent with mood. Thought process and thought content is coherent. Pt denies SI/HI/AVH. There is no indication that the patient is responding to internal stimuli. No delusions elicited during this assessment.    Psychiatric Specialty Exam  Presentation  General Appearance:Casual  Eye Contact:Fair  Speech:Clear and Coherent  Speech Volume:Normal  Handedness:Right   Mood and Affect  Mood: Anxious  Affect: Congruent   Thought Process  Thought Processes: Coherent  Descriptions of Associations:Intact  Orientation:Full (Time, Place and Person)  Thought Content:WDL  Diagnosis of Schizophrenia or Schizoaffective disorder in past: No   Hallucinations:None  Ideas of Reference:None  Suicidal Thoughts:No  Homicidal Thoughts:No   Sensorium  Memory: Immediate Fair  Judgment: Fair  Insight: Fair   Executive Functions  Concentration: Good  Attention Span: Good  Recall: [place] of Knowledge: Good  Language: Good   Psychomotor Activity  Psychomotor Activity: Normal   Assets  Assets: Communication Skills; Desire for Improvement; Social Support   Sleep  Sleep: Poor  Number of  hours:  -1   Nutritional Assessment (For OBS and [facility] admissions only) Has the patient had a weight loss or gain of 10 pounds or more in the last 3 months?: No Has the patient had a decrease in food intake/or appetite?: No Does the patient have dental problems?: No Does the patient have eating habits or behaviors that may be indicators of an eating disorder including binging or inducing vomiting?: No Has the patient recently lost weight without trying?: 0 Has the patient been eating poorly because of a decreased appetite?: 0 Malnutrition Screening Tool Score: 0    Physical Exam: Physical Exam Constitutional:      General: She is not in acute distress.    Appearance: She is not diaphoretic.  HENT:     Head: Normocephalic.     Right Ear: External ear normal.     Left Ear: External ear normal.     Nose: No congestion.  Eyes:     General:        Right eye: No discharge.        Left eye: No discharge.  Cardiovascular:     Rate and Rhythm: Normal rate.  Pulmonary:     Effort: Pulmonary effort is normal. No respiratory distress.  Chest:     Chest wall: No tenderness.  Neurological:     Mental Status: She is alert and oriented to person, place, and time.  Psychiatric:        Attention and Perception: Attention and perception normal.        Mood and Affect: Affect normal. Mood is anxious.        Speech: Speech normal.        Behavior: Behavior is cooperative.        Thought Content: Thought content normal. Thought content is not paranoid or delusional. Thought content does not include homicidal or suicidal ideation. Thought content does not include homicidal or suicidal plan.        Cognition and Memory: Cognition and memory normal.        Judgment: Judgment normal.   Review of Systems  Constitutional:  Negative for chills, diaphoresis and fever.  HENT:  Negative for congestion.   Eyes:  Negative for discharge.  Respiratory:  Negative for cough, shortness of breath and  wheezing.   Cardiovascular:  Negative for chest pain and palpitations.  Gastrointestinal:  Negative for diarrhea, nausea and vomiting.  Neurological:  Negative for tingling, seizures, loss of consciousness, weakness and headaches.  Psychiatric/Behavioral:  Positive for substance abuse. Negative for depression, hallucinations, memory loss and suicidal ideas. The patient is not nervous/anxious and does not have insomnia.    Blood pressure (!) 134/95, pulse 76, temperature 98 F (36.7 C), temperature source Oral, resp. rate 18, SpO2 100 %. There is no height or weight on file to calculate BMI.  Musculoskeletal: Strength & Muscle Tone: within normal limits Gait & Station: normal Patient leans: N/A   [facility] MSE Discharge Disposition for Follow up and Recommendations: Based on my evaluation the patient does not appear to have an emergency medical condition and can be discharged with resources and follow up care in outpatient services for Substance Abuse Intensive Outpatient Program   Recommend discharge to home and follow-up with outpatient services for substance  abuse treatment.  Social worker provided patient with outpatient SA resources.  Patient does not meet inpatient psychiatric admission criteria or IVC criteria.  There is no evidence of imminent risk of harm to self or others presently.  Discussed crisis plan, calling 911, or going to the ED if condition changes or worsens.  Patient verbalized her understanding.  Please refrain from using alcohol or illicit substances, as they can affect your mood and can cause depression, anxiety or other concerning symptoms.  Alcohol can increase the chance that a person will make reckless decisions, like attempting suicide, and can increase the lethality of a drug overdose.   Disp-patient discharged home and condition at discharge is stable.    Mancel Bale, NP 04/01/2022, 2:39 AM

## 2022-04-01 NOTE — Progress Notes (Signed)
[facility] entered resources into AVS  Kristen Porter

## 2023-03-14 ENCOUNTER — Emergency Department (HOSPITAL_COMMUNITY)
Admission: EM | Admit: 2023-03-14 | Discharge: 2023-03-14 | Disposition: A | Discharge status: Home / Self Care | Payer: 59 | Attending: Emergency Medicine | Admitting: Emergency Medicine

## 2023-03-14 ENCOUNTER — Other Ambulatory Visit: Payer: Self-pay

## 2023-03-14 ENCOUNTER — Ambulatory Visit (HOSPITAL_COMMUNITY)
Admission: EM | Admit: 2023-03-14 | Discharge: 2023-03-14 | Disposition: A | Discharge status: Home / Self Care | Payer: 59

## 2023-03-14 DIAGNOSIS — X102XXA Contact with fats and cooking oils, initial encounter: Secondary | ICD-10-CM | POA: Insufficient documentation

## 2023-03-14 DIAGNOSIS — Y93G3 Activity, cooking and baking: Secondary | ICD-10-CM | POA: Insufficient documentation

## 2023-03-14 DIAGNOSIS — T24201A Burn of second degree of unspecified site of right lower limb, except ankle and foot, initial encounter: Secondary | ICD-10-CM | POA: Insufficient documentation

## 2023-03-14 DIAGNOSIS — T300 Burn of unspecified body region, unspecified degree: Secondary | ICD-10-CM

## 2023-03-14 DIAGNOSIS — T310 Burns involving less than 10% of body surface: Secondary | ICD-10-CM | POA: Insufficient documentation

## 2023-03-14 DIAGNOSIS — T24031A Burn of unspecified degree of right lower leg, initial encounter: Secondary | ICD-10-CM | POA: Diagnosis present

## 2023-03-14 MED ORDER — BACITRACIN ZINC 500 UNIT/GM EX OINT: 1.0000 | TOPICAL_OINTMENT | Freq: Every day | CUTANEOUS | 0 refills | Status: AC

## 2023-03-14 MED ORDER — DICLOFENAC SODIUM 25 MG PO TBEC
50.0000 mg | DELAYED_RELEASE_TABLET | Freq: Once | ORAL | Status: AC
  Administered 2023-03-14: 50 mg via ORAL
  Filled 2023-03-14: qty 2

## 2023-03-14 MED ORDER — DICLOFENAC POTASSIUM 50 MG PO TABS: 50.0000 mg | ORAL_TABLET | Freq: Three times a day (TID) | ORAL | 0 refills | Status: AC
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
## 2023-03-14 NOTE — ED Notes (Signed)
Dressings applied with gauze and nonadherent pads.
